# Patient Record
Sex: Female | Born: 1975 | Race: Black or African American | Hispanic: No | Marital: Single | Smoking: Current every day smoker
Health system: Southern US, Community
[De-identification: ages and names within clinical notes are randomized; demographics above are authoritative.]

## PROBLEM LIST (undated history)

## (undated) DIAGNOSIS — O09529 Supervision of elderly multigravida, unspecified trimester: Secondary | ICD-10-CM

## (undated) DIAGNOSIS — O30109 Triplet pregnancy, unspecified number of placenta and unspecified number of amniotic sacs, unspecified trimester: Secondary | ICD-10-CM

## (undated) HISTORY — PX: TUBAL LIGATION: SHX77

## (undated) HISTORY — PX: TONSILLECTOMY: SUR1361

---

## 1999-09-21 ENCOUNTER — Inpatient Hospital Stay (HOSPITAL_COMMUNITY): Admission: AD | Admit: 1999-09-21 | Discharge: 1999-09-24 | Payer: Self-pay | Admitting: *Deleted

## 1999-09-25 ENCOUNTER — Inpatient Hospital Stay: Admission: AD | Admit: 1999-09-25 | Discharge: 1999-09-25 | Payer: Self-pay | Admitting: Obstetrics & Gynecology

## 1999-11-11 ENCOUNTER — Inpatient Hospital Stay (HOSPITAL_COMMUNITY): Admission: AD | Admit: 1999-11-11 | Discharge: 1999-11-11 | Payer: Self-pay | Admitting: Obstetrics

## 1999-11-24 ENCOUNTER — Inpatient Hospital Stay (HOSPITAL_COMMUNITY): Admission: AD | Admit: 1999-11-24 | Discharge: 1999-11-24 | Payer: Self-pay | Admitting: *Deleted

## 1999-12-03 ENCOUNTER — Inpatient Hospital Stay (HOSPITAL_COMMUNITY): Admission: AD | Admit: 1999-12-03 | Discharge: 1999-12-06 | Payer: Self-pay | Admitting: Obstetrics & Gynecology

## 2002-09-25 ENCOUNTER — Inpatient Hospital Stay (HOSPITAL_COMMUNITY): Admission: AD | Admit: 2002-09-25 | Discharge: 2002-09-25 | Payer: Self-pay | Admitting: *Deleted

## 2015-07-09 ENCOUNTER — Other Ambulatory Visit (HOSPITAL_COMMUNITY): Payer: Self-pay | Admitting: Obstetrics and Gynecology

## 2015-07-09 DIAGNOSIS — Z3A19 19 weeks gestation of pregnancy: Secondary | ICD-10-CM

## 2015-07-09 DIAGNOSIS — O30102 Triplet pregnancy, unspecified number of placenta and unspecified number of amniotic sacs, second trimester: Secondary | ICD-10-CM

## 2015-07-09 DIAGNOSIS — Z3689 Encounter for other specified antenatal screening: Secondary | ICD-10-CM

## 2015-07-09 DIAGNOSIS — O0932 Supervision of pregnancy with insufficient antenatal care, second trimester: Secondary | ICD-10-CM

## 2015-07-09 LAB — OB RESULTS CONSOLE HEPATITIS B SURFACE ANTIGEN: Hepatitis B Surface Ag: NEGATIVE

## 2015-07-09 LAB — OB RESULTS CONSOLE RPR
RPR: NONREACTIVE
RPR: NONREACTIVE

## 2015-07-09 LAB — OB RESULTS CONSOLE GC/CHLAMYDIA
CHLAMYDIA, DNA PROBE: NEGATIVE
Gonorrhea: NEGATIVE

## 2015-07-09 LAB — CYTOLOGY - PAP

## 2015-07-09 LAB — OB RESULTS CONSOLE HGB/HCT, BLOOD
HCT: 30 %
Hemoglobin: 10 g/dL

## 2015-07-09 LAB — OB RESULTS CONSOLE ANTIBODY SCREEN: ANTIBODY SCREEN: NEGATIVE

## 2015-07-09 LAB — OB RESULTS CONSOLE RUBELLA ANTIBODY, IGM: Rubella: IMMUNE

## 2015-07-09 LAB — OB RESULTS CONSOLE PLATELET COUNT: Platelets: 323 10*3/uL

## 2015-07-09 LAB — OB RESULTS CONSOLE ABO/RH: RH Type: POSITIVE

## 2015-07-09 LAB — OB RESULTS CONSOLE HIV ANTIBODY (ROUTINE TESTING): HIV: NONREACTIVE

## 2015-07-10 ENCOUNTER — Ambulatory Visit (HOSPITAL_COMMUNITY)
Admission: RE | Admit: 2015-07-10 | Discharge: 2015-07-10 | Disposition: A | Discharge status: Home / Self Care | Payer: Medicaid Other | Source: Ambulatory Visit | Attending: Obstetrics and Gynecology | Admitting: Obstetrics and Gynecology

## 2015-07-10 ENCOUNTER — Encounter (HOSPITAL_COMMUNITY): Payer: Self-pay

## 2015-07-10 ENCOUNTER — Other Ambulatory Visit (HOSPITAL_COMMUNITY): Payer: Self-pay | Admitting: Obstetrics and Gynecology

## 2015-07-10 DIAGNOSIS — O30102 Triplet pregnancy, unspecified number of placenta and unspecified number of amniotic sacs, second trimester: Secondary | ICD-10-CM | POA: Diagnosis not present

## 2015-07-10 DIAGNOSIS — Z3A19 19 weeks gestation of pregnancy: Secondary | ICD-10-CM | POA: Diagnosis not present

## 2015-07-10 DIAGNOSIS — O99332 Smoking (tobacco) complicating pregnancy, second trimester: Secondary | ICD-10-CM

## 2015-07-10 DIAGNOSIS — Z315 Encounter for genetic counseling: Secondary | ICD-10-CM | POA: Insufficient documentation

## 2015-07-10 DIAGNOSIS — O0932 Supervision of pregnancy with insufficient antenatal care, second trimester: Secondary | ICD-10-CM

## 2015-07-10 DIAGNOSIS — Z3689 Encounter for other specified antenatal screening: Secondary | ICD-10-CM

## 2015-07-10 DIAGNOSIS — O09522 Supervision of elderly multigravida, second trimester: Secondary | ICD-10-CM | POA: Diagnosis not present

## 2015-07-10 DIAGNOSIS — O09529 Supervision of elderly multigravida, unspecified trimester: Secondary | ICD-10-CM

## 2015-07-10 HISTORY — DX: Triplet pregnancy, unspecified number of placenta and unspecified number of amniotic sacs, unspecified trimester: O30.109

## 2015-07-10 HISTORY — DX: Supervision of elderly multigravida, unspecified trimester: O09.529

## 2015-07-10 NOTE — Progress Notes (Signed)
Genetic Counseling  High-Risk Gestation Note  Appointment Date:  07/10/2015 Referred By: Marissa Newport, MD Date of Birth:  1975/12/14   Pregnancy History: [id number] Estimated Date of Delivery: 11/29/15 Estimated Gestational Age: [redacted]w[redacted]d Attending: Particia Nearing, MD   [name] was seen for genetic counseling because of a maternal age of 39 y.o. in a triplet (trichorionic/triamniotic) gestation. She will be 39 years old at delivery.   In Summary:   Reviewed maternal age-related risk for aneuploidy.   Available screening for fetal aneuploidy in triplet gestation includes detailed ultrasound.   Maternal serum screening and NIPS not informative for higher order multiples  Detailed ultrasound performed today and within normal limits  Patient declined amniocentesis  Follow-up ultrasound scheduled for 08/07/15  She was counseled regarding maternal age and the association with risk for chromosome conditions due to nondisjunction with aging of the ova.   We reviewed chromosomes, nondisjunction, and the associated 1 in 111 risk for fetal aneuploidy for each triplet related to a maternal age of 39 y.o. at [redacted]w[redacted]d gestation.  She was counseled that the risk for aneuploidy decreases as gestational age increases, accounting for those pregnancies which spontaneously abort.  We specifically discussed Down syndrome (trisomy 59), trisomies 23 and 52, and sex chromosome aneuploidies (47,XXX and 47,XXY) including the common features and prognoses of each.   We reviewed available screening options include detailed ultrasound. Maternal serum screening is not informative in a triplet gestation, and noninvasive prenatal screening (NIPS)/cell free DNA is not validated in a triplet gestation. We reviewed the benefits and limitations of ultrasound as a screening tool for fetal aneuploidy. They were also counseled regarding diagnostic testing via amniocentesis. We reviewed the approximate 1 in 300-500 risk for  complications for amniocentesis, including spontaneous pregnancy loss. After consideration of all the options, she declined amniocentesis.   Detailed ultrasound was performed today. The ultrasound report will be sent under separate cover. Trichorionic triamniotic triplet gestation was confirmed today. There were no visualized fetal anomalies or markers suggestive of aneuploidy. They understand that screening tests cannot rule out all birth defects or genetic syndromes. The patient was advised of this limitation and states she still does not want additional testing at this time.   [name] was provided with written information regarding sickle cell anemia (SCA) including the carrier frequency and incidence in the African-American population, the availability of carrier testing and prenatal diagnosis if indicated.  In addition, we discussed that hemoglobinopathies are routinely screened for as part of the [place] newborn screening panel.  She declined additional discussion regarding this information at the time of today's visit.   Both family histories were reviewed and found to be noncontributory for birth defects, intellectual disability, and known genetic conditions. Without further information regarding the provided family history, an accurate genetic risk cannot be calculated. Further genetic counseling is warranted if more information is obtained.  [name] denied exposure to environmental toxins or chemical agents. She denied the use of alcohol or street drugs. She reported smoking approximately 3 cigarettes per day. The associations of smoking in pregnancy were reviewed and cessation encouraged. She denied significant viral illnesses during the course of her pregnancy. Her medical and surgical histories were noncontributory.   I counseled [name] regarding the above risks and available options.  The approximate face-to-face time with the genetic counselor was 30 minutes.  Quinn Plowman, MS,  Certified Genetic Counselor 07/10/2015

## 2015-07-21 ENCOUNTER — Encounter (HOSPITAL_COMMUNITY): Payer: Self-pay | Admitting: Obstetrics and Gynecology

## 2015-07-28 LAB — COLPOSCOPY

## 2015-08-06 ENCOUNTER — Other Ambulatory Visit (HOSPITAL_COMMUNITY): Payer: Self-pay | Admitting: Maternal and Fetal Medicine

## 2015-08-06 DIAGNOSIS — O99332 Smoking (tobacco) complicating pregnancy, second trimester: Secondary | ICD-10-CM

## 2015-08-06 DIAGNOSIS — O30102 Triplet pregnancy, unspecified number of placenta and unspecified number of amniotic sacs, second trimester: Secondary | ICD-10-CM

## 2015-08-06 DIAGNOSIS — O09522 Supervision of elderly multigravida, second trimester: Secondary | ICD-10-CM

## 2015-08-06 DIAGNOSIS — Z3A23 23 weeks gestation of pregnancy: Secondary | ICD-10-CM

## 2015-08-07 ENCOUNTER — Encounter (HOSPITAL_COMMUNITY): Payer: Self-pay

## 2015-08-07 ENCOUNTER — Ambulatory Visit (HOSPITAL_COMMUNITY)
Admission: RE | Admit: 2015-08-07 | Discharge: 2015-08-07 | Disposition: A | Discharge status: Home / Self Care | Payer: Medicaid Other | Source: Ambulatory Visit | Attending: Obstetrics and Gynecology | Admitting: Obstetrics and Gynecology

## 2015-08-07 VITALS — BP 120/79 | HR 126 | Wt 141.4 lb

## 2015-08-07 DIAGNOSIS — Z3A23 23 weeks gestation of pregnancy: Secondary | ICD-10-CM | POA: Insufficient documentation

## 2015-08-07 DIAGNOSIS — O09522 Supervision of elderly multigravida, second trimester: Secondary | ICD-10-CM | POA: Insufficient documentation

## 2015-08-07 DIAGNOSIS — O99332 Smoking (tobacco) complicating pregnancy, second trimester: Secondary | ICD-10-CM | POA: Diagnosis not present

## 2015-08-07 DIAGNOSIS — Z3A26 26 weeks gestation of pregnancy: Secondary | ICD-10-CM

## 2015-08-07 DIAGNOSIS — O30102 Triplet pregnancy, unspecified number of placenta and unspecified number of amniotic sacs, second trimester: Secondary | ICD-10-CM | POA: Insufficient documentation

## 2015-08-07 DIAGNOSIS — O0932 Supervision of pregnancy with insufficient antenatal care, second trimester: Secondary | ICD-10-CM | POA: Insufficient documentation

## 2015-08-20 ENCOUNTER — Encounter: Payer: Self-pay | Admitting: *Deleted

## 2015-08-20 ENCOUNTER — Encounter: Payer: Medicaid Other | Admitting: Family

## 2015-08-21 ENCOUNTER — Encounter: Payer: Medicaid Other | Admitting: Family Medicine

## 2015-08-29 ENCOUNTER — Ambulatory Visit (HOSPITAL_COMMUNITY): Payer: Medicaid Other

## 2015-08-29 ENCOUNTER — Telehealth: Payer: Self-pay | Admitting: *Deleted

## 2015-08-29 NOTE — Telephone Encounter (Signed)
Returned patient's call. She stated she has had a bad toothache and is in a lot of pain. Wants to know what she can take. I told her that the only thing I could recommend at this time is Tylenol for pain, she can not take ibuprofen or aleve while pregnant. Asked her if she has a dentist she can see, she said no. Stated her mouth is swollen. She is scheduled to come for her initial ob appt on Mon, 10/24.  Told patient she probably needed to come to [facility] for eval and treatment with antibiotics. Patient stated she would do that.

## 2015-08-29 NOTE — Telephone Encounter (Signed)
Received message left on nurse line on 08/28/15 at 1405.  Patient states she is a new patient with her first appointment on 09/01/15.  States she is pregnant with triplets and has a very bad tooth ache.  Wants to know what she can take.  Requests a return call.

## 2015-09-01 ENCOUNTER — Other Ambulatory Visit: Payer: Self-pay | Admitting: Obstetrics and Gynecology

## 2015-09-01 ENCOUNTER — Ambulatory Visit (INDEPENDENT_AMBULATORY_CARE_PROVIDER_SITE_OTHER): Payer: Medicaid Other | Admitting: Obstetrics and Gynecology

## 2015-09-01 ENCOUNTER — Encounter: Payer: Self-pay | Admitting: *Deleted

## 2015-09-01 ENCOUNTER — Encounter: Payer: Self-pay | Admitting: Obstetrics and Gynecology

## 2015-09-01 VITALS — BP 106/66 | HR 104 | Temp 98.9°F | Ht 64.0 in | Wt 143.4 lb

## 2015-09-01 DIAGNOSIS — Z23 Encounter for immunization: Secondary | ICD-10-CM

## 2015-09-01 DIAGNOSIS — O30109 Triplet pregnancy, unspecified number of placenta and unspecified number of amniotic sacs, unspecified trimester: Secondary | ICD-10-CM | POA: Diagnosis not present

## 2015-09-01 DIAGNOSIS — O0990 Supervision of high risk pregnancy, unspecified, unspecified trimester: Secondary | ICD-10-CM | POA: Insufficient documentation

## 2015-09-01 DIAGNOSIS — O09522 Supervision of elderly multigravida, second trimester: Secondary | ICD-10-CM | POA: Diagnosis not present

## 2015-09-01 DIAGNOSIS — O99330 Smoking (tobacco) complicating pregnancy, unspecified trimester: Secondary | ICD-10-CM

## 2015-09-01 DIAGNOSIS — O30102 Triplet pregnancy, unspecified number of placenta and unspecified number of amniotic sacs, second trimester: Secondary | ICD-10-CM | POA: Diagnosis not present

## 2015-09-01 DIAGNOSIS — E0869 Diabetes mellitus due to underlying condition with other specified complication: Secondary | ICD-10-CM

## 2015-09-01 LAB — POCT URINALYSIS DIP (DEVICE)
Glucose, UA: NEGATIVE mg/dL
Hgb urine dipstick: NEGATIVE
KETONES UR: NEGATIVE mg/dL
Leukocytes, UA: NEGATIVE
Nitrite: NEGATIVE
PH: 7 (ref 5.0–8.0)
PROTEIN: 30 mg/dL — AB
SPECIFIC GRAVITY, URINE: 1.02 (ref 1.005–1.030)
UROBILINOGEN UA: 1 mg/dL (ref 0.0–1.0)

## 2015-09-01 LAB — CBC
HEMATOCRIT: 30.3 % — AB (ref 36.0–46.0)
HEMOGLOBIN: 10 g/dL — AB (ref 12.0–15.0)
MCH: 26.2 pg (ref 26.0–34.0)
MCHC: 33 g/dL (ref 30.0–36.0)
MCV: 79.5 fL (ref 78.0–100.0)
MPV: 9.2 fL (ref 8.6–12.4)
Platelets: 304 10*3/uL (ref 150–400)
RBC: 3.81 MIL/uL — ABNORMAL LOW (ref 3.87–5.11)
RDW: 14.7 % (ref 11.5–15.5)
WBC: 7.2 10*3/uL (ref 4.0–10.5)

## 2015-09-01 MED ORDER — ASPIRIN EC 81 MG PO TBEC: 81.0000 mg | DELAYED_RELEASE_TABLET | Freq: Every day | ORAL | Status: DC

## 2015-09-01 NOTE — Progress Notes (Signed)
Subjective:  Marissa Whitaker is a 39 y.o. G7P5015 at 4429w2d being seen today for ongoing initial care.  Patient reports no complaints.  Contractions: Not present.  Vag. Bleeding: None. Movement: Present. Denies leaking of fluid.   The following portions of the patient's history were reviewed and updated as appropriate: allergies, current medications, past family history, past medical history, past social history, past surgical history and problem list. Problem list updated.  Objective:   Filed Vitals:   09/01/15 0933 09/01/15 0935  BP: 106/66   Pulse: 104   Temp: 98.9 F (37.2 C)   Height:  5\' 4"  (1.626 m)  Weight: 143 lb 6.4 oz (65.046 kg)     Fetal Status: Fetal Heart Rate (bpm): 135/128/134 Fundal Height: 39 cm Movement: Present     General:  Alert, oriented and cooperative. Patient is in no acute distress.  Skin: Skin is warm and dry. No rash noted.   Cardiovascular: Normal heart rate noted  Respiratory: Normal respiratory effort, no problems with respiration noted  Abdomen: Soft, gravid, appropriate for gestational age. Pain/Pressure: Present     Pelvic: Vag. Bleeding: None     Cervical exam deferred        Extremities: Normal range of motion.  Edema: None  Mental Status: Normal mood and affect. Normal behavior. Normal judgment and thought content.   Urinalysis: Urine Protein: Trace Urine Glucose: Negative  Assessment and Plan:  Pregnancy: [id number] at 5029w2d  # Pregnancy - Glucose Tolerance, 1 HR (50g) - cbc, hiv, rpr with this test - pt reporting ascus pap, requesting those records - flu and tdap today  2. Tobacco use during pregnancy, unspecified trimester - discussed dangers, recommended quitting. Has reduced, declines nrt  # multifetal gestation - starting aspirin - q4 wk mfm u/s, discordance 16% last scan, growth wnl  Preterm labor symptoms and general obstetric precautions including but not limited to vaginal bleeding, contractions, leaking of fluid and fetal  movement were reviewed in detail with the patient. Please refer to After Visit Summary for other counseling recommendations.  Return in about 2 weeks (around 09/15/2015).   Kathrynn RunningNoah Bedford Horton Ellithorpe, MD

## 2015-09-01 NOTE — Addendum Note (Signed)
Addended by: Cheree DittoGRAHAM, Milbern Doescher A on: 09/01/2015 10:21 AM   Modules accepted: Orders

## 2015-09-02 ENCOUNTER — Encounter: Payer: Self-pay | Admitting: *Deleted

## 2015-09-02 ENCOUNTER — Ambulatory Visit (HOSPITAL_COMMUNITY): Payer: Medicaid Other

## 2015-09-02 LAB — GLUCOSE TOLERANCE, 1 HOUR (50G) W/O FASTING: Glucose, 1 Hour GTT: 107 mg/dL (ref 70–140)

## 2015-09-02 LAB — RPR

## 2015-09-02 LAB — HIV ANTIBODY (ROUTINE TESTING W REFLEX): HIV 1&2 Ab, 4th Generation: NONREACTIVE

## 2015-09-02 NOTE — Addendum Note (Signed)
Addended by: Shonna ChockWOUK, NOAH B on: 09/02/2015 09:51 AM   Modules accepted: Orders

## 2015-09-02 NOTE — Addendum Note (Signed)
Addended by: Kathee DeltonHILLMAN, CARRIE L on: 09/02/2015 11:34 AM   Modules accepted: Orders

## 2015-09-03 ENCOUNTER — Encounter: Payer: Self-pay | Admitting: Family Medicine

## 2015-09-03 DIAGNOSIS — N870 Mild cervical dysplasia: Secondary | ICD-10-CM | POA: Insufficient documentation

## 2015-09-04 ENCOUNTER — Telehealth: Payer: Self-pay | Admitting: Obstetrics and Gynecology

## 2015-09-04 LAB — FERRITIN: Ferritin: 13 ng/mL (ref 10–291)

## 2015-09-04 MED ORDER — FERROUS SULFATE 325 (65 FE) MG PO TABS: 325.0000 mg | ORAL_TABLET | Freq: Every day | ORAL | Status: AC

## 2015-09-04 NOTE — Telephone Encounter (Signed)
Per Dr Ashok PallWouk, called patient to let her know she is anemic. She has a Fe prescription waiting at her pharmacy. Patient voiced understanding.

## 2015-09-04 NOTE — Telephone Encounter (Signed)
H 10, ferritin low, will start oral iron, nursing to inform

## 2015-09-15 ENCOUNTER — Encounter: Payer: Medicaid Other | Admitting: Family Medicine

## 2015-09-15 ENCOUNTER — Ambulatory Visit (HOSPITAL_COMMUNITY): Payer: Self-pay

## 2015-09-16 ENCOUNTER — Other Ambulatory Visit (HOSPITAL_COMMUNITY): Payer: Self-pay | Admitting: Maternal and Fetal Medicine

## 2015-09-16 ENCOUNTER — Ambulatory Visit (HOSPITAL_COMMUNITY)
Admission: RE | Admit: 2015-09-16 | Discharge: 2015-09-16 | Disposition: A | Discharge status: Home / Self Care | Payer: Medicaid Other | Source: Ambulatory Visit | Attending: Obstetrics and Gynecology | Admitting: Obstetrics and Gynecology

## 2015-09-16 VITALS — BP 107/72 | HR 105 | Wt 148.0 lb

## 2015-09-16 DIAGNOSIS — O09522 Supervision of elderly multigravida, second trimester: Secondary | ICD-10-CM

## 2015-09-16 DIAGNOSIS — O30102 Triplet pregnancy, unspecified number of placenta and unspecified number of amniotic sacs, second trimester: Secondary | ICD-10-CM | POA: Insufficient documentation

## 2015-09-16 DIAGNOSIS — Z3A26 26 weeks gestation of pregnancy: Secondary | ICD-10-CM

## 2015-09-16 DIAGNOSIS — O30103 Triplet pregnancy, unspecified number of placenta and unspecified number of amniotic sacs, third trimester: Secondary | ICD-10-CM

## 2015-09-22 ENCOUNTER — Encounter: Payer: Self-pay | Admitting: Family Medicine

## 2015-09-22 ENCOUNTER — Ambulatory Visit (INDEPENDENT_AMBULATORY_CARE_PROVIDER_SITE_OTHER): Payer: Medicaid Other | Admitting: Family Medicine

## 2015-09-22 VITALS — BP 106/76 | HR 95 | Temp 99.1°F | Wt 148.3 lb

## 2015-09-22 DIAGNOSIS — O09523 Supervision of elderly multigravida, third trimester: Secondary | ICD-10-CM | POA: Diagnosis not present

## 2015-09-22 DIAGNOSIS — O0993 Supervision of high risk pregnancy, unspecified, third trimester: Secondary | ICD-10-CM

## 2015-09-22 DIAGNOSIS — O09529 Supervision of elderly multigravida, unspecified trimester: Secondary | ICD-10-CM

## 2015-09-22 DIAGNOSIS — O4700 False labor before 37 completed weeks of gestation, unspecified trimester: Secondary | ICD-10-CM | POA: Insufficient documentation

## 2015-09-22 DIAGNOSIS — O30103 Triplet pregnancy, unspecified number of placenta and unspecified number of amniotic sacs, third trimester: Secondary | ICD-10-CM | POA: Diagnosis present

## 2015-09-22 DIAGNOSIS — O4703 False labor before 37 completed weeks of gestation, third trimester: Secondary | ICD-10-CM | POA: Diagnosis not present

## 2015-09-22 LAB — POCT URINALYSIS DIP (DEVICE)
Bilirubin Urine: NEGATIVE
Glucose, UA: NEGATIVE mg/dL
HGB URINE DIPSTICK: NEGATIVE
Ketones, ur: NEGATIVE mg/dL
Leukocytes, UA: NEGATIVE
Nitrite: NEGATIVE
PROTEIN: 30 mg/dL — AB
Specific Gravity, Urine: 1.02 (ref 1.005–1.030)
UROBILINOGEN UA: 0.2 mg/dL (ref 0.0–1.0)
pH: 7 (ref 5.0–8.0)

## 2015-09-22 MED ORDER — BETAMETHASONE SOD PHOS & ACET 6 (3-3) MG/ML IJ SUSP
12.0000 mg | INTRAMUSCULAR | Status: AC
  Administered 2015-09-22 – 2015-09-23 (×2): 12 mg via INTRAMUSCULAR

## 2015-09-22 MED ORDER — NIFEDIPINE ER OSMOTIC RELEASE 30 MG PO TB24: 30.0000 mg | ORAL_TABLET | Freq: Two times a day (BID) | ORAL | Status: DC

## 2015-09-22 NOTE — Progress Notes (Signed)
Subjective:  Marissa Whitaker is a 39 y.o. G7P5015 at 2170w2d being seen today for ongoing prenatal care.  Patient reports contractions since 2 days ago every 7-8 minutes, with ? LOF.  Contractions: Regular.  Vag. Bleeding: None. Movement: Present. Denies leaking of fluid.   The following portions of the patient's history were reviewed and updated as appropriate: allergies, current medications, past family history, past medical history, past social history, past surgical history and problem list. Problem list updated.  Objective:   Filed Vitals:   09/22/15 1040  BP: 106/76  Pulse: 95  Temp: 99.1 F (37.3 C)  Weight: 148 lb 4.8 oz (67.268 kg)    Fetal Status: Fetal Heart Rate (bpm): 133/133/138 Fundal Height: 44 cm Movement: Present  Presentation: Vertex  General:  Alert, oriented and cooperative. Patient is in no acute distress.  Skin: Skin is warm and dry. No rash noted.   Cardiovascular: Normal heart rate noted  Respiratory: Normal respiratory effort, no problems with respiration noted  Abdomen: Soft, gravid, appropriate for gestational age. Pain/Pressure: Present     Pelvic: Vag. Bleeding: None Vag D/C Character: Watery   Cervical exam performed Dilation: 2 Effacement (%): Thick Station: -3  Extremities: Normal range of motion.  Edema: None  Mental Status: Normal mood and affect. Normal behavior. Normal judgment and thought content.   Urinalysis: Urine Protein: Trace Urine Glucose: Negative  Assessment and Plan:  Pregnancy: [id number] at 270w2d  1. Supervision of high-risk pregnancy, third trimester Continue routine prenatal care.  2. Threatened preterm labor, third trimester Begin Procardia PTL precautions BMZ today and tomorrow - NIFEdipine (PROCARDIA XL) 30 MG 24 hr tablet; Take 1 tablet (30 mg total) by mouth 2 (two) times daily.  Dispense: 60 tablet; Refill: 1 - betamethasone acetate-betamethasone sodium phosphate (CELESTONE) injection 12 mg; Inject 2 mLs (12 mg total) into the  muscle every 24 hours x 2 doses.  3. Triplet gestation in third trimester, unspecified placenta and amniotic sac number Normal growth x 3 and vtx x 3  Preterm labor symptoms and general obstetric precautions including but not limited to vaginal bleeding, contractions, leaking of fluid and fetal movement were reviewed in detail with the patient. Please refer to After Visit Summary for other counseling recommendations.  Return in 2 weeks (on 10/06/2015).   Reva Boresanya S Pratt, MD

## 2015-09-22 NOTE — Patient Instructions (Signed)
Third Trimester of Pregnancy The third trimester is from week 29 through week 42, months 7 through 9. The third trimester is a time when the fetus is growing rapidly. At the end of the ninth month, the fetus is about 20 inches in length and weighs 6-10 pounds.  BODY CHANGES Your body goes through many changes during pregnancy. The changes vary from woman to woman.   Your weight will continue to increase. You can expect to gain 25-35 pounds (11-16 kg) by the end of the pregnancy.  You may begin to get stretch marks on your hips, abdomen, and breasts.  You may urinate more often because the fetus is moving lower into your pelvis and pressing on your bladder.  You may develop or continue to have heartburn as a result of your pregnancy.  You may develop constipation because certain hormones are causing the muscles that push waste through your intestines to slow down.  You may develop hemorrhoids or swollen, bulging veins (varicose veins).  You may have pelvic pain because of the weight gain and pregnancy hormones relaxing your joints between the bones in your pelvis. Backaches may result from overexertion of the muscles supporting your posture.  You may have changes in your hair. These can include thickening of your hair, rapid growth, and changes in texture. Some women also have hair loss during or after pregnancy, or hair that feels dry or thin. Your hair will most likely return to normal after your baby is born.  Your breasts will continue to grow and be tender. A yellow discharge may leak from your breasts called colostrum.  Your belly button may stick out.  You may feel short of breath because of your expanding uterus.  You may notice the fetus "dropping," or moving lower in your abdomen.  You may have a bloody mucus discharge. This usually occurs a few days to a week before labor begins.  Your cervix becomes thin and soft (effaced) near your due date. WHAT TO EXPECT AT YOUR  PRENATAL EXAMS  You will have prenatal exams every 2 weeks until week 36. Then, you will have weekly prenatal exams. During a routine prenatal visit:  You will be weighed to make sure you and the fetus are growing normally.  Your blood pressure is taken.  Your abdomen will be measured to track your baby's growth.  The fetal heartbeat will be listened to.  Any test results from the previous visit will be discussed.  You may have a cervical check near your due date to see if you have effaced. At around 36 weeks, your caregiver will check your cervix. At the same time, your caregiver will also perform a test on the secretions of the vaginal tissue. This test is to determine if a type of bacteria, Group B streptococcus, is present. Your caregiver will explain this further. Your caregiver may ask you:  What your birth plan is.  How you are feeling.  If you are feeling the baby move.  If you have had any abnormal symptoms, such as leaking fluid, bleeding, severe headaches, or abdominal cramping.  If you are using any tobacco products, including cigarettes, chewing tobacco, and electronic cigarettes.  If you have any questions. Other tests or screenings that may be performed during your third trimester include:  Blood tests that check for low iron levels (anemia).  Fetal testing to check the health, activity level, and growth of the fetus. Testing is done if you have certain medical conditions or if   there are problems during the pregnancy.  HIV (human immunodeficiency virus) testing. If you are at high risk, you may be screened for HIV during your third trimester of pregnancy. FALSE LABOR You may feel small, irregular contractions that eventually go away. These are called Braxton Hicks contractions, or false labor. Contractions may last for hours, days, or even weeks before true labor sets in. If contractions come at regular intervals, intensify, or become painful, it is best to be seen  by your caregiver.  SIGNS OF LABOR   Menstrual-like cramps.  Contractions that are 5 minutes apart or less.  Contractions that start on the top of the uterus and spread down to the lower abdomen and back.  A sense of increased pelvic pressure or back pain.  A watery or bloody mucus discharge that comes from the vagina. If you have any of these signs before the 37th week of pregnancy, call your caregiver right away. You need to go to the hospital to get checked immediately. HOME CARE INSTRUCTIONS   Avoid all smoking, herbs, alcohol, and unprescribed drugs. These chemicals affect the formation and growth of the baby.  Do not use any tobacco products, including cigarettes, chewing tobacco, and electronic cigarettes. If you need help quitting, ask your health care provider. You may receive counseling support and other resources to help you quit.  Follow your caregiver's instructions regarding medicine use. There are medicines that are either safe or unsafe to take during pregnancy.  Exercise only as directed by your caregiver. Experiencing uterine cramps is a good sign to stop exercising.  Continue to eat regular, healthy meals.  Wear a good support bra for breast tenderness.  Do not use hot tubs, steam rooms, or saunas.  Wear your seat belt at all times when driving.  Avoid raw meat, uncooked cheese, cat litter boxes, and soil used by cats. These carry germs that can cause birth defects in the baby.  Take your prenatal vitamins.  Take 1500-2000 mg of calcium daily starting at the 20th week of pregnancy until you deliver your baby.  Try taking a stool softener (if your caregiver approves) if you develop constipation. Eat more high-fiber foods, such as fresh vegetables or fruit and whole grains. Drink plenty of fluids to keep your urine clear or pale yellow.  Take warm sitz baths to soothe any pain or discomfort caused by hemorrhoids. Use hemorrhoid cream if your caregiver  approves.  If you develop varicose veins, wear support hose. Elevate your feet for 15 minutes, 3-4 times a day. Limit salt in your diet.  Avoid heavy lifting, wear low heal shoes, and practice good posture.  Rest a lot with your legs elevated if you have leg cramps or low back pain.  Visit your dentist if you have not gone during your pregnancy. Use a soft toothbrush to brush your teeth and be gentle when you floss.  A sexual relationship may be continued unless your caregiver directs you otherwise.  Do not travel far distances unless it is absolutely necessary and only with the approval of your caregiver.  Take prenatal classes to understand, practice, and ask questions about the labor and delivery.  Make a trial run to the hospital.  Pack your hospital bag.  Prepare the baby's nursery.  Continue to go to all your prenatal visits as directed by your caregiver. SEEK MEDICAL CARE IF:  You are unsure if you are in labor or if your water has broken.  You have dizziness.  You have   mild pelvic cramps, pelvic pressure, or nagging pain in your abdominal area.  You have persistent nausea, vomiting, or diarrhea.  You have a bad smelling vaginal discharge.  You have pain with urination. SEEK IMMEDIATE MEDICAL CARE IF:   You have a fever.  You are leaking fluid from your vagina.  You have spotting or bleeding from your vagina.  You have severe abdominal cramping or pain.  You have rapid weight loss or gain.  You have shortness of breath with chest pain.  You notice sudden or extreme swelling of your face, hands, ankles, feet, or legs.  You have not felt your baby move in over an hour.  You have severe headaches that do not go away with medicine.  You have vision changes.   This information is not intended to replace advice given to you by your health care provider. Make sure you discuss any questions you have with your health care provider.   Document Released:  10/19/2001 Document Revised: 11/15/2014 Document Reviewed: 12/26/2012 Elsevier Interactive Patient Education 2016 [employer]  Breastfeeding Deciding to breastfeed is one of the best choices you can make for you and your baby. A change in hormones during pregnancy causes your breast tissue to grow and increases the number and size of your milk ducts. These hormones also allow proteins, sugars, and fats from your blood supply to make breast milk in your milk-producing glands. Hormones prevent breast milk from being released before your baby is born as well as prompt milk flow after birth. Once breastfeeding has begun, thoughts of your baby, as well as his or her sucking or crying, can stimulate the release of milk from your milk-producing glands.  BENEFITS OF BREASTFEEDING For Your Baby  Your first milk (colostrum) helps your baby's digestive system function better.  There are antibodies in your milk that help your baby fight off infections.  Your baby has a lower incidence of asthma, allergies, and sudden infant death syndrome.  The nutrients in breast milk are better for your baby than infant formulas and are designed uniquely for your baby's needs.  Breast milk improves your baby's brain development.  Your baby is less likely to develop other conditions, such as childhood obesity, asthma, or type 2 diabetes mellitus. For You  Breastfeeding helps to create a very special bond between you and your baby.  Breastfeeding is convenient. Breast milk is always available at the correct temperature and costs nothing.  Breastfeeding helps to burn calories and helps you lose the weight gained during pregnancy.  Breastfeeding makes your uterus contract to its prepregnancy size faster and slows bleeding (lochia) after you give birth.   Breastfeeding helps to lower your risk of developing type 2 diabetes mellitus, osteoporosis, and breast or ovarian cancer later in life. SIGNS THAT YOUR BABY IS  HUNGRY Early Signs of Hunger  Increased alertness or activity.  Stretching.  Movement of the head from side to side.  Movement of the head and opening of the mouth when the corner of the mouth or cheek is stroked (rooting).  Increased sucking sounds, smacking lips, cooing, sighing, or squeaking.  Hand-to-mouth movements.  Increased sucking of fingers or hands. Late Signs of Hunger  Fussing.  Intermittent crying. Extreme Signs of Hunger Signs of extreme hunger will require calming and consoling before your baby will be able to breastfeed successfully. Do not wait for the following signs of extreme hunger to occur before you initiate breastfeeding:  Restlessness.  A loud, strong cry.  Screaming.   BREASTFEEDING BASICS Breastfeeding Initiation  Find a comfortable place to sit or lie down, with your neck and back well supported.  Place a pillow or rolled up blanket under your baby to bring him or her to the level of your breast (if you are seated). Nursing pillows are specially designed to help support your arms and your baby while you breastfeed.  Make sure that your baby's abdomen is facing your abdomen.  Gently massage your breast. With your fingertips, massage from your chest wall toward your nipple in a circular motion. This encourages milk flow. You may need to continue this action during the feeding if your milk flows slowly.  Support your breast with 4 fingers underneath and your thumb above your nipple. Make sure your fingers are well away from your nipple and your baby's mouth.  Stroke your baby's lips gently with your finger or nipple.  When your baby's mouth is open wide enough, quickly bring your baby to your breast, placing your entire nipple and as much of the colored area around your nipple (areola) as possible into your baby's mouth.  More areola should be visible above your baby's upper lip than below the lower lip.  Your baby's tongue should be between his  or her lower gum and your breast.  Ensure that your baby's mouth is correctly positioned around your nipple (latched). Your baby's lips should create a seal on your breast and be turned out (everted).  It is common for your baby to suck about 2-3 minutes in order to start the flow of breast milk. Latching Teaching your baby how to latch on to your breast properly is very important. An improper latch can cause nipple pain and decreased milk supply for you and poor weight gain in your baby. Also, if your baby is not latched onto your nipple properly, he or she may swallow some air during feeding. This can make your baby fussy. Burping your baby when you switch breasts during the feeding can help to get rid of the air. However, teaching your baby to latch on properly is still the best way to prevent fussiness from swallowing air while breastfeeding. Signs that your baby has successfully latched on to your nipple:  Silent tugging or silent sucking, without causing you pain.  Swallowing heard between every 3-4 sucks.  Muscle movement above and in front of his or her ears while sucking. Signs that your baby has not successfully latched on to nipple:  Sucking sounds or smacking sounds from your baby while breastfeeding.  Nipple pain. If you think your baby has not latched on correctly, slip your finger into the corner of your baby's mouth to break the suction and place it between your baby's gums. Attempt breastfeeding initiation again. Signs of Successful Breastfeeding Signs from your baby:  A gradual decrease in the number of sucks or complete cessation of sucking.  Falling asleep.  Relaxation of his or her body.  Retention of a small amount of milk in his or her mouth.  Letting go of your breast by himself or herself. Signs from you:  Breasts that have increased in firmness, weight, and size 1-3 hours after feeding.  Breasts that are softer immediately after  breastfeeding.  Increased milk volume, as well as a change in milk consistency and color by the fifth day of breastfeeding.  Nipples that are not sore, cracked, or bleeding. Signs That Your Baby is Getting Enough Milk  Wetting at least 3 diapers in a 24-hour period.   The urine should be clear and pale yellow by age 5 days.  At least 3 stools in a 24-hour period by age 5 days. The stool should be soft and yellow.  At least 3 stools in a 24-hour period by age 7 days. The stool should be seedy and yellow.  No loss of weight greater than 10% of birth weight during the first 3 days of age.  Average weight gain of 4-7 ounces (113-198 g) per week after age 4 days.  Consistent daily weight gain by age 5 days, without weight loss after the age of 2 weeks. After a feeding, your baby may spit up a small amount. This is common. BREASTFEEDING FREQUENCY AND DURATION Frequent feeding will help you make more milk and can prevent sore nipples and breast engorgement. Breastfeed when you feel the need to reduce the fullness of your breasts or when your baby shows signs of hunger. This is called "breastfeeding on demand." Avoid introducing a pacifier to your baby while you are working to establish breastfeeding (the first 4-6 weeks after your baby is born). After this time you may choose to use a pacifier. Research has shown that pacifier use during the first year of a baby's life decreases the risk of sudden infant death syndrome (SIDS). Allow your baby to feed on each breast as long as he or she wants. Breastfeed until your baby is finished feeding. When your baby unlatches or falls asleep while feeding from the first breast, offer the second breast. Because newborns are often sleepy in the first few weeks of life, you may need to awaken your baby to get him or her to feed. Breastfeeding times will vary from baby to baby. However, the following rules can serve as a guide to help you ensure that your baby is  properly fed:  Newborns (babies 4 weeks of age or younger) may breastfeed every 1-3 hours.  Newborns should not go longer than 3 hours during the day or 5 hours during the night without breastfeeding.  You should breastfeed your baby a minimum of 8 times in a 24-hour period until you begin to introduce solid foods to your baby at around 6 months of age. BREAST MILK PUMPING Pumping and storing breast milk allows you to ensure that your baby is exclusively fed your breast milk, even at times when you are unable to breastfeed. This is especially important if you are going back to work while you are still breastfeeding or when you are not able to be present during feedings. Your lactation consultant can give you guidelines on how long it is safe to store breast milk. A breast pump is a machine that allows you to pump milk from your breast into a sterile bottle. The pumped breast milk can then be stored in a refrigerator or freezer. Some breast pumps are operated by hand, while others use electricity. Ask your lactation consultant which type will work best for you. Breast pumps can be purchased, but some hospitals and breastfeeding support groups lease breast pumps on a monthly basis. A lactation consultant can teach you how to hand express breast milk, if you prefer not to use a pump. CARING FOR YOUR BREASTS WHILE YOU BREASTFEED Nipples can become dry, cracked, and sore while breastfeeding. The following recommendations can help keep your breasts moisturized and healthy:  Avoid using soap on your nipples.  Wear a supportive bra. Although not required, special nursing bras and tank tops are designed to allow access to your   breasts for breastfeeding without taking off your entire bra or top. Avoid wearing underwire-style bras or extremely tight bras.  Air dry your nipples for 3-4minutes after each feeding.  Use only cotton bra pads to absorb leaked breast milk. Leaking of breast milk between feedings  is normal.  Use lanolin on your nipples after breastfeeding. Lanolin helps to maintain your skin's normal moisture barrier. If you use pure lanolin, you do not need to wash it off before feeding your baby again. Pure lanolin is not toxic to your baby. You may also hand express a few drops of breast milk and gently massage that milk into your nipples and allow the milk to air dry. In the first few weeks after giving birth, some women experience extremely full breasts (engorgement). Engorgement can make your breasts feel heavy, warm, and tender to the touch. Engorgement peaks within 3-5 days after you give birth. The following recommendations can help ease engorgement:  Completely empty your breasts while breastfeeding or pumping. You may want to start by applying warm, moist heat (in the shower or with warm water-soaked hand towels) just before feeding or pumping. This increases circulation and helps the milk flow. If your baby does not completely empty your breasts while breastfeeding, pump any extra milk after he or she is finished.  Wear a snug bra (nursing or regular) or tank top for 1-2 days to signal your body to slightly decrease milk production.  Apply ice packs to your breasts, unless this is too uncomfortable for you.  Make sure that your baby is latched on and positioned properly while breastfeeding. If engorgement persists after 48 hours of following these recommendations, contact your health care provider or a lactation consultant. OVERALL HEALTH CARE RECOMMENDATIONS WHILE BREASTFEEDING  Eat healthy foods. Alternate between meals and snacks, eating 3 of each per day. Because what you eat affects your breast milk, some of the foods may make your baby more irritable than usual. Avoid eating these foods if you are sure that they are negatively affecting your baby.  Drink milk, fruit juice, and water to satisfy your thirst (about 10 glasses a day).  Rest often, relax, and continue to take  your prenatal vitamins to prevent fatigue, stress, and anemia.  Continue breast self-awareness checks.  Avoid chewing and smoking tobacco. Chemicals from cigarettes that pass into breast milk and exposure to secondhand smoke may harm your baby.  Avoid alcohol and drug use, including marijuana. Some medicines that may be harmful to your baby can pass through breast milk. It is important to ask your health care provider before taking any medicine, including all over-the-counter and prescription medicine as well as vitamin and herbal supplements. It is possible to become pregnant while breastfeeding. If birth control is desired, ask your health care provider about options that will be safe for your baby. SEEK MEDICAL CARE IF:  You feel like you want to stop breastfeeding or have become frustrated with breastfeeding.  You have painful breasts or nipples.  Your nipples are cracked or bleeding.  Your breasts are red, tender, or warm.  You have a swollen area on either breast.  You have a fever or chills.  You have nausea or vomiting.  You have drainage other than breast milk from your nipples.  Your breasts do not become full before feedings by the fifth day after you give birth.  You feel sad and depressed.  Your baby is too sleepy to eat well.  Your baby is having trouble sleeping.     Your baby is wetting less than 3 diapers in a 24-hour period.  Your baby has less than 3 stools in a 24-hour period.  Your baby's skin or the white part of his or her eyes becomes yellow.   Your baby is not gaining weight by 5 days of age. SEEK IMMEDIATE MEDICAL CARE IF:  Your baby is overly tired (lethargic) and does not want to wake up and feed.  Your baby develops an unexplained fever.   This information is not intended to replace advice given to you by your health care provider. Make sure you discuss any questions you have with your health care provider.   Document Released: 10/25/2005  Document Revised: 07/16/2015 Document Reviewed: 04/18/2013 Elsevier Interactive Patient Education 2016 [employer]  

## 2015-09-22 NOTE — Progress Notes (Signed)
Breastfeeding tip of the week reviewed Patient complains of watery leakage x2 days, contractions q7-8 minutes

## 2015-09-23 ENCOUNTER — Encounter (HOSPITAL_COMMUNITY): Payer: Self-pay

## 2015-09-23 ENCOUNTER — Encounter: Payer: Self-pay | Admitting: Obstetrics & Gynecology

## 2015-09-23 ENCOUNTER — Ambulatory Visit (HOSPITAL_COMMUNITY)
Admission: RE | Admit: 2015-09-23 | Discharge: 2015-09-23 | Disposition: A | Discharge status: Home / Self Care | Payer: Medicaid Other | Source: Ambulatory Visit | Attending: Obstetrics and Gynecology | Admitting: Obstetrics and Gynecology

## 2015-09-23 ENCOUNTER — Ambulatory Visit (INDEPENDENT_AMBULATORY_CARE_PROVIDER_SITE_OTHER): Payer: Medicaid Other | Admitting: *Deleted

## 2015-09-23 DIAGNOSIS — O0993 Supervision of high risk pregnancy, unspecified, third trimester: Secondary | ICD-10-CM

## 2015-09-23 DIAGNOSIS — O4703 False labor before 37 completed weeks of gestation, third trimester: Secondary | ICD-10-CM | POA: Diagnosis not present

## 2015-09-23 DIAGNOSIS — O30103 Triplet pregnancy, unspecified number of placenta and unspecified number of amniotic sacs, third trimester: Secondary | ICD-10-CM | POA: Diagnosis present

## 2015-09-23 NOTE — Progress Notes (Signed)
2nd Betamethasone injection given IM to LUOQ gluteal.  Pt tolerated well. US for BPP and UA dopplers today.

## 2015-09-24 ENCOUNTER — Other Ambulatory Visit: Payer: Self-pay | Admitting: Family Medicine

## 2015-09-24 DIAGNOSIS — O210 Mild hyperemesis gravidarum: Secondary | ICD-10-CM

## 2015-09-24 MED ORDER — PROMETHAZINE HCL 25 MG PO TABS: 25.0000 mg | ORAL_TABLET | Freq: Four times a day (QID) | ORAL | Status: DC | PRN

## 2015-09-30 ENCOUNTER — Encounter (HOSPITAL_COMMUNITY): Payer: Self-pay

## 2015-09-30 ENCOUNTER — Ambulatory Visit (HOSPITAL_COMMUNITY)
Admission: RE | Admit: 2015-09-30 | Discharge: 2015-09-30 | Disposition: A | Discharge status: Home / Self Care | Payer: Medicaid Other | Source: Ambulatory Visit | Attending: Obstetrics and Gynecology | Admitting: Obstetrics and Gynecology

## 2015-09-30 ENCOUNTER — Other Ambulatory Visit (HOSPITAL_COMMUNITY): Payer: Self-pay | Admitting: Maternal and Fetal Medicine

## 2015-09-30 DIAGNOSIS — O0933 Supervision of pregnancy with insufficient antenatal care, third trimester: Secondary | ICD-10-CM | POA: Insufficient documentation

## 2015-09-30 DIAGNOSIS — O09523 Supervision of elderly multigravida, third trimester: Secondary | ICD-10-CM

## 2015-09-30 DIAGNOSIS — O30103 Triplet pregnancy, unspecified number of placenta and unspecified number of amniotic sacs, third trimester: Secondary | ICD-10-CM

## 2015-09-30 DIAGNOSIS — O365931 Maternal care for other known or suspected poor fetal growth, third trimester, fetus 1: Secondary | ICD-10-CM

## 2015-09-30 DIAGNOSIS — O99333 Smoking (tobacco) complicating pregnancy, third trimester: Secondary | ICD-10-CM

## 2015-09-30 DIAGNOSIS — Z3A31 31 weeks gestation of pregnancy: Secondary | ICD-10-CM

## 2015-09-30 DIAGNOSIS — O0930 Supervision of pregnancy with insufficient antenatal care, unspecified trimester: Secondary | ICD-10-CM

## 2015-10-06 ENCOUNTER — Encounter: Payer: Medicaid Other | Admitting: Obstetrics and Gynecology

## 2015-10-07 ENCOUNTER — Ambulatory Visit (HOSPITAL_COMMUNITY)
Admission: RE | Admit: 2015-10-07 | Discharge: 2015-10-07 | Disposition: A | Discharge status: Home / Self Care | Payer: Medicaid Other | Source: Ambulatory Visit | Attending: Obstetrics and Gynecology | Admitting: Obstetrics and Gynecology

## 2015-10-07 ENCOUNTER — Inpatient Hospital Stay (HOSPITAL_COMMUNITY)
Admission: AD | Admit: 2015-10-07 | Discharge: 2015-10-09 | DRG: 766 | Disposition: A | Discharge status: Home / Self Care | Payer: Medicaid Other | Source: Ambulatory Visit | Attending: Obstetrics & Gynecology | Admitting: Obstetrics & Gynecology

## 2015-10-07 ENCOUNTER — Encounter (HOSPITAL_COMMUNITY)
Admission: AD | Disposition: A | Discharge status: Home / Self Care | Payer: Self-pay | Source: Ambulatory Visit | Attending: Obstetrics & Gynecology

## 2015-10-07 ENCOUNTER — Inpatient Hospital Stay (HOSPITAL_COMMUNITY): Payer: Medicaid Other | Admitting: Anesthesiology

## 2015-10-07 ENCOUNTER — Encounter (HOSPITAL_COMMUNITY): Payer: Self-pay

## 2015-10-07 ENCOUNTER — Other Ambulatory Visit (HOSPITAL_COMMUNITY): Payer: Self-pay | Admitting: Maternal and Fetal Medicine

## 2015-10-07 ENCOUNTER — Ambulatory Visit (INDEPENDENT_AMBULATORY_CARE_PROVIDER_SITE_OTHER): Payer: Medicaid Other | Admitting: Certified Nurse Midwife

## 2015-10-07 VITALS — BP 119/72 | HR 92 | Temp 98.4°F | Wt 149.0 lb

## 2015-10-07 DIAGNOSIS — O99333 Smoking (tobacco) complicating pregnancy, third trimester: Secondary | ICD-10-CM

## 2015-10-07 DIAGNOSIS — O365930 Maternal care for other known or suspected poor fetal growth, third trimester, not applicable or unspecified: Secondary | ICD-10-CM

## 2015-10-07 DIAGNOSIS — O26893 Other specified pregnancy related conditions, third trimester: Secondary | ICD-10-CM | POA: Diagnosis present

## 2015-10-07 DIAGNOSIS — O0930 Supervision of pregnancy with insufficient antenatal care, unspecified trimester: Secondary | ICD-10-CM

## 2015-10-07 DIAGNOSIS — Z3A32 32 weeks gestation of pregnancy: Secondary | ICD-10-CM

## 2015-10-07 DIAGNOSIS — O328XX0 Maternal care for other malpresentation of fetus, not applicable or unspecified: Secondary | ICD-10-CM | POA: Diagnosis present

## 2015-10-07 DIAGNOSIS — O0933 Supervision of pregnancy with insufficient antenatal care, third trimester: Secondary | ICD-10-CM

## 2015-10-07 DIAGNOSIS — O99334 Smoking (tobacco) complicating childbirth: Secondary | ICD-10-CM | POA: Diagnosis present

## 2015-10-07 DIAGNOSIS — O365931 Maternal care for other known or suspected poor fetal growth, third trimester, fetus 1: Secondary | ICD-10-CM | POA: Insufficient documentation

## 2015-10-07 DIAGNOSIS — O09523 Supervision of elderly multigravida, third trimester: Secondary | ICD-10-CM

## 2015-10-07 DIAGNOSIS — O328XX2 Maternal care for other malpresentation of fetus, fetus 2: Secondary | ICD-10-CM

## 2015-10-07 DIAGNOSIS — N879 Dysplasia of cervix uteri, unspecified: Secondary | ICD-10-CM | POA: Diagnosis present

## 2015-10-07 DIAGNOSIS — Z98891 History of uterine scar from previous surgery: Secondary | ICD-10-CM

## 2015-10-07 DIAGNOSIS — Z302 Encounter for sterilization: Secondary | ICD-10-CM

## 2015-10-07 DIAGNOSIS — O0993 Supervision of high risk pregnancy, unspecified, third trimester: Secondary | ICD-10-CM

## 2015-10-07 DIAGNOSIS — O30113 Triplet pregnancy with two or more monochorionic fetuses, third trimester: Secondary | ICD-10-CM | POA: Diagnosis present

## 2015-10-07 DIAGNOSIS — O328XX1 Maternal care for other malpresentation of fetus, fetus 1: Secondary | ICD-10-CM | POA: Diagnosis not present

## 2015-10-07 DIAGNOSIS — O328XX3 Maternal care for other malpresentation of fetus, fetus 3: Secondary | ICD-10-CM

## 2015-10-07 DIAGNOSIS — O30103 Triplet pregnancy, unspecified number of placenta and unspecified number of amniotic sacs, third trimester: Secondary | ICD-10-CM

## 2015-10-07 LAB — POCT URINALYSIS DIP (DEVICE)
Bilirubin Urine: NEGATIVE
Glucose, UA: NEGATIVE mg/dL
Hgb urine dipstick: NEGATIVE
Nitrite: NEGATIVE
Protein, ur: 100 mg/dL — AB
Specific Gravity, Urine: 1.02 (ref 1.005–1.030)
Urobilinogen, UA: 1 mg/dL (ref 0.0–1.0)
pH: 6.5 (ref 5.0–8.0)

## 2015-10-07 LAB — CBC
HCT: 30.8 % — ABNORMAL LOW (ref 36.0–46.0)
HEMOGLOBIN: 10 g/dL — AB (ref 12.0–15.0)
MCH: 24.8 pg — AB (ref 26.0–34.0)
MCHC: 32.5 g/dL (ref 30.0–36.0)
MCV: 76.2 fL — AB (ref 78.0–100.0)
Platelets: 239 10*3/uL (ref 150–400)
RBC: 4.04 MIL/uL (ref 3.87–5.11)
RDW: 15.3 % (ref 11.5–15.5)
WBC: 8.1 10*3/uL (ref 4.0–10.5)

## 2015-10-07 LAB — TYPE AND SCREEN
ABO/RH(D): B POS
Antibody Screen: NEGATIVE

## 2015-10-07 LAB — ABO/RH: ABO/RH(D): B POS

## 2015-10-07 SURGERY — Surgical Case
Anesthesia: Spinal

## 2015-10-07 MED ORDER — SIMETHICONE 80 MG PO CHEW: 80.0000 mg | CHEWABLE_TABLET | ORAL | Status: DC | PRN

## 2015-10-07 MED ORDER — ACETAMINOPHEN 325 MG PO TABS
650.0000 mg | ORAL_TABLET | ORAL | Status: DC | PRN
  Administered 2015-10-09: 650 mg via ORAL
  Filled 2015-10-07: qty 2

## 2015-10-07 MED ORDER — DIPHENHYDRAMINE HCL 25 MG PO CAPS: 25.0000 mg | ORAL_CAPSULE | ORAL | Status: DC | PRN

## 2015-10-07 MED ORDER — WITCH HAZEL-GLYCERIN EX PADS
1.0000 | MEDICATED_PAD | CUTANEOUS | Status: DC | PRN
Start: 2015-10-07 — End: 2015-10-09

## 2015-10-07 MED ORDER — LACTATED RINGERS IV SOLN
INTRAVENOUS | Status: DC
  Administered 2015-10-07 – 2015-10-08 (×2): via INTRAVENOUS

## 2015-10-07 MED ORDER — KETOROLAC TROMETHAMINE 30 MG/ML IJ SOLN
INTRAMUSCULAR | Status: AC
  Administered 2015-10-07: 30 mg via INTRAMUSCULAR
  Filled 2015-10-07: qty 1

## 2015-10-07 MED ORDER — NALBUPHINE HCL 10 MG/ML IJ SOLN: 5.0000 mg | Freq: Once | INTRAMUSCULAR | Status: DC | PRN

## 2015-10-07 MED ORDER — SIMETHICONE 80 MG PO CHEW
80.0000 mg | CHEWABLE_TABLET | ORAL | Status: DC
  Administered 2015-10-07 – 2015-10-09 (×2): 80 mg via ORAL
  Filled 2015-10-07 (×2): qty 1

## 2015-10-07 MED ORDER — FERROUS SULFATE 325 (65 FE) MG PO TABS
325.0000 mg | ORAL_TABLET | Freq: Two times a day (BID) | ORAL | Status: DC
  Administered 2015-10-08 – 2015-10-09 (×3): 325 mg via ORAL
  Filled 2015-10-07 (×3): qty 1

## 2015-10-07 MED ORDER — NALBUPHINE HCL 10 MG/ML IJ SOLN: 5.0000 mg | INTRAMUSCULAR | Status: DC | PRN

## 2015-10-07 MED ORDER — FENTANYL CITRATE (PF) 100 MCG/2ML IJ SOLN
INTRAMUSCULAR | Status: AC
  Filled 2015-10-07: qty 2

## 2015-10-07 MED ORDER — SODIUM CHLORIDE 0.9 % IJ SOLN: 3.0000 mL | INTRAMUSCULAR | Status: DC | PRN

## 2015-10-07 MED ORDER — LANOLIN HYDROUS EX OINT: 1.0000 "application " | TOPICAL_OINTMENT | CUTANEOUS | Status: DC | PRN

## 2015-10-07 MED ORDER — OXYTOCIN 10 UNIT/ML IJ SOLN
40.0000 [IU] | INTRAVENOUS | Status: DC | PRN
  Administered 2015-10-07: 40 [IU] via INTRAVENOUS

## 2015-10-07 MED ORDER — TERBUTALINE SULFATE 1 MG/ML IJ SOLN
INTRAMUSCULAR | Status: AC
  Filled 2015-10-07: qty 1

## 2015-10-07 MED ORDER — BUPIVACAINE HCL (PF) 0.5 % IJ SOLN
INTRAMUSCULAR | Status: AC
  Filled 2015-10-07: qty 30

## 2015-10-07 MED ORDER — PHENYLEPHRINE HCL 10 MG/ML IJ SOLN
INTRAMUSCULAR | Status: DC | PRN
  Administered 2015-10-07: 80 ug via INTRAVENOUS
  Administered 2015-10-07: 40 ug via INTRAVENOUS
  Administered 2015-10-07 (×3): 80 ug via INTRAVENOUS

## 2015-10-07 MED ORDER — MEPERIDINE HCL 25 MG/ML IJ SOLN: 6.2500 mg | INTRAMUSCULAR | Status: DC | PRN

## 2015-10-07 MED ORDER — FENTANYL CITRATE (PF) 100 MCG/2ML IJ SOLN
INTRAMUSCULAR | Status: DC | PRN
  Administered 2015-10-07: 10 ug via INTRATHECAL
  Administered 2015-10-07: 90 ug via INTRAVENOUS

## 2015-10-07 MED ORDER — TETANUS-DIPHTH-ACELL PERTUSSIS 5-2.5-18.5 LF-MCG/0.5 IM SUSP: 0.5000 mL | Freq: Once | INTRAMUSCULAR | Status: DC

## 2015-10-07 MED ORDER — PRENATAL MULTIVITAMIN CH
1.0000 | ORAL_TABLET | Freq: Every day | ORAL | Status: DC
  Administered 2015-10-08 – 2015-10-09 (×2): 1 via ORAL
  Filled 2015-10-07 (×2): qty 1

## 2015-10-07 MED ORDER — OXYTOCIN 40 UNITS IN LACTATED RINGERS INFUSION - SIMPLE MED: 62.5000 mL/h | INTRAVENOUS | Status: AC

## 2015-10-07 MED ORDER — FAMOTIDINE IN NACL 20-0.9 MG/50ML-% IV SOLN
20.0000 mg | Freq: Once | INTRAVENOUS | Status: AC
  Administered 2015-10-07: 20 mg via INTRAVENOUS
  Filled 2015-10-07: qty 50

## 2015-10-07 MED ORDER — DEXAMETHASONE SODIUM PHOSPHATE 10 MG/ML IJ SOLN
INTRAMUSCULAR | Status: AC
  Filled 2015-10-07: qty 1

## 2015-10-07 MED ORDER — MAGNESIUM HYDROXIDE 400 MG/5ML PO SUSP: 30.0000 mL | ORAL | Status: DC | PRN

## 2015-10-07 MED ORDER — CEFAZOLIN SODIUM-DEXTROSE 2-3 GM-% IV SOLR
2.0000 g | INTRAVENOUS | Status: AC
  Administered 2015-10-07: 2 g via INTRAVENOUS
  Filled 2015-10-07: qty 50

## 2015-10-07 MED ORDER — PHENYLEPHRINE 8 MG IN D5W 100 ML (0.08MG/ML) PREMIX OPTIME
INJECTION | INTRAVENOUS | Status: AC
  Filled 2015-10-07: qty 100

## 2015-10-07 MED ORDER — DIPHENHYDRAMINE HCL 50 MG/ML IJ SOLN: 12.5000 mg | INTRAMUSCULAR | Status: DC | PRN

## 2015-10-07 MED ORDER — DEXTROSE 5 % IV SOLN: 1.0000 ug/kg/h | INTRAVENOUS | Status: DC | PRN

## 2015-10-07 MED ORDER — NALOXONE HCL 0.4 MG/ML IJ SOLN: 0.4000 mg | INTRAMUSCULAR | Status: DC | PRN

## 2015-10-07 MED ORDER — PROMETHAZINE HCL 25 MG/ML IJ SOLN: 6.2500 mg | INTRAMUSCULAR | Status: DC | PRN

## 2015-10-07 MED ORDER — BUPIVACAINE HCL (PF) 0.5 % IJ SOLN
INTRAMUSCULAR | Status: DC | PRN
  Administered 2015-10-07: 30 mL

## 2015-10-07 MED ORDER — SENNOSIDES-DOCUSATE SODIUM 8.6-50 MG PO TABS
2.0000 | ORAL_TABLET | ORAL | Status: DC
  Administered 2015-10-07 – 2015-10-09 (×2): 2 via ORAL
  Filled 2015-10-07 (×2): qty 2

## 2015-10-07 MED ORDER — DEXAMETHASONE SODIUM PHOSPHATE 10 MG/ML IJ SOLN
INTRAMUSCULAR | Status: DC | PRN
  Administered 2015-10-07: 10 mg via INTRAVENOUS

## 2015-10-07 MED ORDER — TERBUTALINE SULFATE 1 MG/ML IJ SOLN
0.2500 mg | Freq: Once | INTRAMUSCULAR | Status: AC
  Administered 2015-10-07: 0.25 mg via SUBCUTANEOUS

## 2015-10-07 MED ORDER — LACTATED RINGERS IV SOLN
INTRAVENOUS | Status: DC
  Administered 2015-10-07 (×2): via INTRAVENOUS

## 2015-10-07 MED ORDER — SCOPOLAMINE 1 MG/3DAYS TD PT72
MEDICATED_PATCH | TRANSDERMAL | Status: DC | PRN
  Administered 2015-10-07: 1 via TRANSDERMAL

## 2015-10-07 MED ORDER — ACETAMINOPHEN 500 MG PO TABS
1000.0000 mg | ORAL_TABLET | Freq: Four times a day (QID) | ORAL | Status: AC
  Administered 2015-10-07 – 2015-10-08 (×3): 1000 mg via ORAL
  Filled 2015-10-07 (×4): qty 2

## 2015-10-07 MED ORDER — OXYCODONE-ACETAMINOPHEN 5-325 MG PO TABS
2.0000 | ORAL_TABLET | ORAL | Status: DC | PRN
  Administered 2015-10-08: 2 via ORAL
  Filled 2015-10-07 (×2): qty 2

## 2015-10-07 MED ORDER — LACTATED RINGERS IV BOLUS (SEPSIS): 1000.0000 mL | Freq: Once | INTRAVENOUS | Status: DC

## 2015-10-07 MED ORDER — KETOROLAC TROMETHAMINE 30 MG/ML IJ SOLN
30.0000 mg | Freq: Four times a day (QID) | INTRAMUSCULAR | Status: DC | PRN
Start: 2015-10-07 — End: 2015-10-07

## 2015-10-07 MED ORDER — IBUPROFEN 600 MG PO TABS
600.0000 mg | ORAL_TABLET | Freq: Four times a day (QID) | ORAL | Status: DC
  Administered 2015-10-07 – 2015-10-09 (×7): 600 mg via ORAL
  Filled 2015-10-07 (×7): qty 1

## 2015-10-07 MED ORDER — ONDANSETRON HCL 4 MG/2ML IJ SOLN
INTRAMUSCULAR | Status: DC | PRN
  Administered 2015-10-07: 4 mg via INTRAVENOUS

## 2015-10-07 MED ORDER — DIBUCAINE 1 % RE OINT
1.0000 | TOPICAL_OINTMENT | RECTAL | Status: DC | PRN
Start: 2015-10-07 — End: 2015-10-09

## 2015-10-07 MED ORDER — MEASLES, MUMPS & RUBELLA VAC ~~LOC~~ INJ: 0.5000 mL | INJECTION | Freq: Once | SUBCUTANEOUS | Status: DC

## 2015-10-07 MED ORDER — DIPHENHYDRAMINE HCL 25 MG PO CAPS: 25.0000 mg | ORAL_CAPSULE | Freq: Four times a day (QID) | ORAL | Status: DC | PRN

## 2015-10-07 MED ORDER — OXYTOCIN 10 UNIT/ML IJ SOLN
INTRAMUSCULAR | Status: AC
  Filled 2015-10-07: qty 4

## 2015-10-07 MED ORDER — CITRIC ACID-SODIUM CITRATE 334-500 MG/5ML PO SOLN
30.0000 mL | Freq: Once | ORAL | Status: AC
  Administered 2015-10-07: 30 mL via ORAL
  Filled 2015-10-07: qty 15

## 2015-10-07 MED ORDER — MORPHINE SULFATE (PF) 0.5 MG/ML IJ SOLN
INTRAMUSCULAR | Status: DC | PRN
  Administered 2015-10-07: .2 mg via INTRATHECAL

## 2015-10-07 MED ORDER — MORPHINE SULFATE (PF) 0.5 MG/ML IJ SOLN
INTRAMUSCULAR | Status: AC
  Filled 2015-10-07: qty 10

## 2015-10-07 MED ORDER — FERROUS SULFATE 325 (65 FE) MG PO TABS: 325.0000 mg | ORAL_TABLET | Freq: Two times a day (BID) | ORAL | Status: DC

## 2015-10-07 MED ORDER — SCOPOLAMINE 1 MG/3DAYS TD PT72
MEDICATED_PATCH | TRANSDERMAL | Status: AC
  Filled 2015-10-07: qty 1

## 2015-10-07 MED ORDER — LACTATED RINGERS IV SOLN
INTRAVENOUS | Status: DC | PRN
  Administered 2015-10-07: 18:00:00 via INTRAVENOUS

## 2015-10-07 MED ORDER — ONDANSETRON HCL 4 MG/2ML IJ SOLN
INTRAMUSCULAR | Status: AC
  Filled 2015-10-07: qty 2

## 2015-10-07 MED ORDER — PHENYLEPHRINE HCL 10 MG/ML IJ SOLN
20.0000 mg | INTRAVENOUS | Status: DC | PRN
  Administered 2015-10-07: 60 ug/min via INTRAVENOUS

## 2015-10-07 MED ORDER — ZOLPIDEM TARTRATE 5 MG PO TABS
5.0000 mg | ORAL_TABLET | Freq: Every evening | ORAL | Status: DC | PRN
  Filled 2015-10-07: qty 1

## 2015-10-07 MED ORDER — ONDANSETRON HCL 4 MG/2ML IJ SOLN: 4.0000 mg | Freq: Three times a day (TID) | INTRAMUSCULAR | Status: DC | PRN

## 2015-10-07 MED ORDER — BUPIVACAINE IN DEXTROSE 0.75-8.25 % IT SOLN
INTRATHECAL | Status: DC | PRN
  Administered 2015-10-07: 1.7 mg via INTRATHECAL

## 2015-10-07 MED ORDER — OXYCODONE-ACETAMINOPHEN 5-325 MG PO TABS
1.0000 | ORAL_TABLET | ORAL | Status: DC | PRN
  Administered 2015-10-08: 1 via ORAL
  Filled 2015-10-07: qty 1

## 2015-10-07 MED ORDER — KETOROLAC TROMETHAMINE 30 MG/ML IJ SOLN
30.0000 mg | Freq: Four times a day (QID) | INTRAMUSCULAR | Status: DC | PRN
  Administered 2015-10-07: 30 mg via INTRAMUSCULAR

## 2015-10-07 MED ORDER — MENTHOL 3 MG MT LOZG: 1.0000 | LOZENGE | OROMUCOSAL | Status: DC | PRN

## 2015-10-07 SURGICAL SUPPLY — 38 items
APL SKNCLS STERI-STRIP NONHPOA (GAUZE/BANDAGES/DRESSINGS) ×1
BENZOIN TINCTURE PRP APPL 2/3 (GAUZE/BANDAGES/DRESSINGS) ×2 IMPLANT
CLAMP CORD UMBIL (MISCELLANEOUS) IMPLANT
CLIP FILSHIE TUBAL LIGA STRL (Clip) ×4 IMPLANT
CLOSURE WOUND 1/2 X4 (GAUZE/BANDAGES/DRESSINGS) ×1
CLOTH BEACON ORANGE TIMEOUT ST (SAFETY) ×3 IMPLANT
DRAPE SHEET LG 3/4 BI-LAMINATE (DRAPES) IMPLANT
DRSG OPSITE POSTOP 4X10 (GAUZE/BANDAGES/DRESSINGS) ×3 IMPLANT
DURAPREP 26ML APPLICATOR (WOUND CARE) ×3 IMPLANT
ELECT REM PT RETURN 9FT ADLT (ELECTROSURGICAL) ×3
ELECTRODE REM PT RTRN 9FT ADLT (ELECTROSURGICAL) ×1 IMPLANT
EXTRACTOR VACUUM M CUP 4 TUBE (SUCTIONS) IMPLANT
EXTRACTOR VACUUM M CUP 4' TUBE (SUCTIONS)
GLOVE BIOGEL PI IND STRL 7.0 (GLOVE) ×3 IMPLANT
GLOVE BIOGEL PI INDICATOR 7.0 (GLOVE) ×6
GLOVE ECLIPSE 7.0 STRL STRAW (GLOVE) ×3 IMPLANT
GOWN STRL REUS W/TWL LRG LVL3 (GOWN DISPOSABLE) ×6 IMPLANT
KIT ABG SYR 3ML LUER SLIP (SYRINGE) IMPLANT
NDL HYPO 25X5/8 SAFETYGLIDE (NEEDLE) ×1 IMPLANT
NEEDLE HYPO 22GX1.5 SAFETY (NEEDLE) ×3 IMPLANT
NEEDLE HYPO 25X5/8 SAFETYGLIDE (NEEDLE) ×3 IMPLANT
NS IRRIG 1000ML POUR BTL (IV SOLUTION) ×3 IMPLANT
PACK C SECTION WH (CUSTOM PROCEDURE TRAY) ×3 IMPLANT
PAD ABD 7.5X8 STRL (GAUZE/BANDAGES/DRESSINGS) ×3 IMPLANT
PAD OB MATERNITY 4.3X12.25 (PERSONAL CARE ITEMS) ×3 IMPLANT
PENCIL SMOKE EVAC W/HOLSTER (ELECTROSURGICAL) ×3 IMPLANT
RTRCTR C-SECT PINK 25CM LRG (MISCELLANEOUS) IMPLANT
STRIP CLOSURE SKIN 1/2X4 (GAUZE/BANDAGES/DRESSINGS) ×1 IMPLANT
SUT PDS AB 0 CT1 27 (SUTURE) IMPLANT
SUT PDS AB 0 CTX 36 PDP370T (SUTURE) IMPLANT
SUT PLAIN 2 0 XLH (SUTURE) IMPLANT
SUT VIC AB 0 CT1 36 (SUTURE) ×6 IMPLANT
SUT VIC AB 0 CTX 36 (SUTURE) ×6
SUT VIC AB 0 CTX36XBRD ANBCTRL (SUTURE) ×2 IMPLANT
SUT VIC AB 4-0 KS 27 (SUTURE) ×3 IMPLANT
SYR CONTROL 10ML LL (SYRINGE) ×3 IMPLANT
TOWEL OR 17X24 6PK STRL BLUE (TOWEL DISPOSABLE) ×3 IMPLANT
TRAY FOLEY CATH SILVER 14FR (SET/KITS/TRAYS/PACK) ×3 IMPLANT

## 2015-10-07 NOTE — Op Note (Signed)
Marissa Whitaker PROCEDURE DATE: 10/07/2015  PREOPERATIVE DIAGNOSES: Intrauterine pregnancy at [redacted]w[redacted]d weeks gestation; triplets; active preterm labor; undesired fertility  POSTOPERATIVE DIAGNOSES: The same  PROCEDURE: Primary Low Transverse Cesarean Section, Bilateral Tubal Sterilization using Filshie clips  SURGEON:  Dr. Jaynie Collins  ASSISTANT:  Dr. Lorain Childes  ANESTHESIOLOGIST: Dr. Arrie Aran  INDICATIONS: Marissa Whitaker is a 39 y.o. [id number] at [redacted]w[redacted]d here for urgent cesarean section and bilateral tubal sterilization secondary to the indications listed under preoperative diagnoses; please see preoperative notes for further details.  The risks of surgery were discussed with the patient including but were not limited to: bleeding which may require transfusion or reoperation; infection which may require antibiotics; injury to bowel, bladder, ureters or other surrounding organs; injury to the fetus; need for additional procedures including hysterectomy in the event of a life-threatening hemorrhage; placental abnormalities wth subsequent pregnancies, incisional problems, thromboembolic phenomenon and other postoperative/anesthesia complications.  Patient also desires permanent sterilization.  Other reversible forms of contraception were discussed with patient; she declines all other modalities. Risks of procedure discussed with patient including but not limited to: risk of regret, permanence of method, bleeding, infection, injury to surrounding organs and need for additional procedures.  Failure risk of 1-2% with increased risk of ectopic gestation if pregnancy occurs was also discussed with patient.  The patient concurred with the proposed plan, giving informed written consent for the procedures.    FINDINGS:  Viable female infants delivered in cephalic/complete breech/footling breech presentations.  Apgars were pending at the time of this documentation; please refer to delivery summary.  Clear  amniotic fluid x 3.  Intact placenta and three vessel cord x 3.  Normal uterus, fallopian tubes and ovaries bilaterally. Fallopian tubes sterilized with Filshie clips bilaterally.  ANESTHESIA: Spinal INTRAVENOUS FLUIDS: 2400 ml ESTIMATED BLOOD LOSS: 800 ml URINE OUTPUT:  100 ml SPECIMENS: Placenta sent to pathology COMPLICATIONS: None immediate  PROCEDURE IN DETAIL:  The patient preoperatively received intravenous antibiotics and had sequential compression devices applied to her lower extremities.   She was then taken to the operating room where spinal anesthesia was administered and was found to be adequate. She was then placed in a dorsal supine position with a leftward tilt, and prepped and draped in a sterile manner.  A foley catheter was placed into her bladder and attached to constant gravity.  After an adequate timeout was performed, a Pfannenstiel skin incision was made with scalpel and carried through to the underlying layer of fascia. The fascia was incised in the midline, and this incision was extended bilaterally using the Mayo scissors.  Kocher clamps were applied to the superior aspect of the fascial incision and the underlying rectus muscles were dissected off bluntly. A similar process was carried out on the inferior aspect of the fascial incision. The rectus muscles were separated in the midline bluntly and the peritoneum was entered bluntly. Attention was turned to the lower uterine segment where a low transverse hysterotomy was made with a scalpel and extended bilaterally bluntly.  The infants were sequentially delivered in the aforementioned presentations, each cord was clamped and cut after one minute, and the infants were handed over to awaiting neonatology team. Uterine massage was then administered, and the placenta delivered intact with a three-vessel cord. The uterus was then cleared of clot and debris.  The hysterotomy was closed with 0 Vicryl in a running locked fashion, and an  imbricating layer was also placed with 0 Vicryl.   Attention was then turned to the  fallopian tubes, and Filshie clips were placed about 3 cm from the cornua, with care given to incorporate the underlying mesosalpinx on both sides, allowing for bilateral tubal sterilization. The pelvis was cleared of all clot and debris. Hemostasis was confirmed on all surfaces.  The peritoneum and the muscles were reapproximated using 0 Vicryl interrupted stitches. The fascia was then closed using 0 Vicryl in a running fashion.  The subcutaneous layer was irrigated,  and 30 ml of 0.5% Marcaine was injected subcutaneously around the incision.  The skin was closed with a 4-0 Vicryl subcuticular stitch. The patient tolerated the procedure well. Sponge, lap, instrument and needle counts were correct x 3.  She was taken to the recovery room in stable condition.     Jaynie CollinsUGONNA  ANYANWU, MD, FACOG Attending Obstetrician & Gynecologist Faculty Practice, [facility] [place]

## 2015-10-07 NOTE — Patient Instructions (Signed)

## 2015-10-07 NOTE — Progress Notes (Signed)
Subjective:  Marissa Whitaker is a 39 y.o. G7P5015 at 3452w3d being seen today for ongoing prenatal care.  She is currently monitored for the following issues for this high-risk pregnancy and has Advanced maternal age in multigravida; Tobacco use during pregnancy; Supervision of high-risk pregnancy; Triplet gestation; Dysplasia of cervix, low grade (CIN 1); and Threatened preterm labor on her problem list.  Patient reports contractions since earlier today.  Contractions: Regular. Vag. Bleeding: None.  Movement: Present. Denies leaking of fluid.   The following portions of the patient's history were reviewed and updated as appropriate: allergies, current medications, past family history, past medical history, past social history, past surgical history and problem list. Problem list updated.  Objective:   Filed Vitals:   10/07/15 1628  BP: 119/72  Pulse: 92  Temp: 98.4 F (36.9 C)  Weight: 149 lb (67.586 kg)    Fetal Status:     Movement: Present     General:  Alert, oriented and cooperative. Patient is in no acute distress.  Skin: Skin is warm and dry. No rash noted.   Cardiovascular: Normal heart rate noted  Respiratory: Normal respiratory effort, no problems with respiration noted  Abdomen: Soft, gravid, appropriate for gestational age. Pain/Pressure: Present     Pelvic: Vag. Bleeding: None Vag D/C Character: Mucous   Cervical exam performed        Extremities: Normal range of motion.  Edema: None  Mental Status: Normal mood and affect. Normal behavior. Normal judgment and thought content.   Urinalysis:      Assessment and Plan:  Pregnancy: [id number] at 7552w3d  There are no diagnoses linked to this encounter. Preterm labor symptoms and general obstetric precautions including but not limited to vaginal bleeding, contractions, leaking of fluid and fetal movement were reviewed in detail with the patient. Please refer to After Visit Summary for other counseling recommendations.  No  Follow-up on file. Sent to MAU. Dr. Alvester MorinNewton notified.  Marissa Whitaker, CNM

## 2015-10-07 NOTE — MAU Note (Signed)
Pt presents for evaluation of contractions and possible labor. Dilated 3cm in office today. Triplets. Denies bleeding or leaking. Reports good fetal movement.

## 2015-10-07 NOTE — H&P (Signed)
OBSTETRIC ADMISSION HISTORY AND PHYSICAL  Marissa Whitaker is a 39 y.o. female [phone] with IUP at [redacted]w[redacted]d by 19 presenting for contractions. She was seen in slinic and found to be 3 cm. Contractions started 1 week ago. She reports +FMs, No LOF, no VB, no blurry vision, headaches or peripheral edema, and RUQ pain.  She plans on bottle/breast feeding. She request BTL for birth control.  Pregnancy complicated by 1) Tri/Tri gestation 2) AMA 3) Tobacco use 4) Late entry PNC 5) Limited PNC (only 4 visits, including today)  Dating: By 37 --->  Estimated Date of Delivery: 11/29/15  Sono:   Multiple [place] for triplet gestation   Prenatal History/Complications:  Clinic  [facility] Prenatal Labs  Dating 19 wk u/s Blood type: B/Positive/-- (08/31 0000)   Genetic Screen  detailed anatomy wnl, declined amnio Antibody:Negative (08/31 0000)  Anatomic US  wnl, triplets Rubella: Immune (08/31 0000)  GTT Early:               Third trimester: 107 RPR: Nonreactive, Nonreactive (08/31 0000)   Flu vaccine  09/01/2015 HBsAg: Negative (08/31 0000)   TDaP vaccine   09/01/2015                                 HIV: Non-reactive (08/31 0000)   Baby Food     Unknown                                          GBS: (For PCN allergy, check sensitivities)  Contraception BTL Pap: ASCUS per patient 2016, records requested-biopsy showed CIN1  Circumcision    Pediatrician    Support Person      Past Medical History: Past Medical History  Diagnosis Date  . Advanced maternal age in multigravida   . Triplet gestation     Past Surgical History: Past Surgical History  Procedure Laterality Date  . Tonsillectomy      Obstetrical History: OB History    Gravida Para Term Preterm AB TAB SAB Ectopic Multiple Living   0 1 0 1 0 0 5      Social History: Social History   Social History  . Marital Status: Single    Spouse Name: N/A  . Number of Children: N/A  . Years of Education: N/A   Social History Main Topics  .  Smoking status: Current Every Day Smoker  . Smokeless tobacco: None  . Alcohol Use: No  . Drug Use: No  . Sexual Activity: Not Asked   Other Topics Concern  . None   Social History Narrative    Family History: History reviewed. No pertinent family history.  Allergies: No Known Allergies  Prescriptions prior to admission  Medication Sig Dispense Refill Last Dose  . aspirin EC 81 MG tablet Take 1 tablet (81 mg total) by mouth daily. Take after 12 weeks for prevention of preeclampsia later in pregnancy 300 tablet 2 Taking  . ferrous sulfate 325 (65 FE) MG tablet Take 1 tablet (325 mg total) by mouth daily with breakfast. 90 tablet 3 Taking  . NIFEdipine (PROCARDIA XL) 30 MG 24 hr tablet Take 1 tablet (30 mg total) by mouth 2 (two) times daily. 60 tablet 1 Taking  . Prenatal Vit-Fe Fumarate-FA (PRENATAL MULTIVITAMIN) TABS tablet Take 1 tablet by mouth daily at 12  noon.   Taking  . promethazine (PHENERGAN) 25 MG tablet Take 1 tablet (25 mg total) by mouth every 6 (six) hours as needed for nausea or vomiting. 30 tablet 2 Taking     Review of Systems   All systems reviewed and negative except as stated in HPI  Blood pressure 122/68, pulse 90, temperature 98.6 F (37 C), temperature source Oral, resp. rate 18, last menstrual period 04/04/2015. General appearance: alert, cooperative and appears stated age Lungs: clear to auscultation bilaterally Heart: regular rate and rhythm Abdomen: soft, non-tender; bowel sounds normal Pelvic: adequate Extremities: Homans sign is negative, no sign of DVT  Presentation: cephalic Fetal monitoring Fetus A 115/mod/+accels Fetus B 120/mod/+accels Fetus C 125/mod/+accels  Uterine activityFrequency: Every 5-8 minutes Dilation: 5 Effacement (%): Thick Exam by:: Dr. Alvester MorinNewton   Prenatal labs: ABO, Rh: B/Positive/-- (08/31 0000) Antibody: Negative (08/31 0000) Rubella: !Error! RPR: NON REAC (10/24 1045)  HBsAg: Negative (08/31 0000)  HIV:  NONREACTIVE (10/24 1045)  GBS:     Prenatal Transfer Tool  Maternal Diabetes: No Genetic Screening: Declined Maternal Ultrasounds/Referrals: Normal Fetal Ultrasounds or other Referrals:  None Maternal Substance Abuse:  Yes:  Type: Smoker Significant Maternal Medications:  Meds include: Other: ASA Significant Maternal Lab Results: Lab values include: Other: GBS Unknown  Results for orders placed or performed in visit on 10/07/15 (from the past 24 hour(s))  POCT urinalysis dip (device)   Collection Time: 10/07/15  5:13 PM  Result Value Ref Range   Glucose, UA NEGATIVE NEGATIVE mg/dL   Bilirubin Urine NEGATIVE NEGATIVE   Ketones, ur TRACE (A) NEGATIVE mg/dL   Specific Gravity, Urine 1.020 1.005 - 1.030   Hgb urine dipstick NEGATIVE NEGATIVE   pH 6.5 5.0 - 8.0   Protein, ur 100 (A) NEGATIVE mg/dL   Urobilinogen, UA 1.0 0.0 - 1.0 mg/dL   Nitrite NEGATIVE NEGATIVE   Leukocytes, UA TRACE (A) NEGATIVE    Patient Active Problem List   Diagnosis Date Noted  . Threatened preterm labor 09/22/2015  . Dysplasia of cervix, low grade (CIN 1) 09/03/2015  . Tobacco use during pregnancy 09/01/2015  . Supervision of high-risk pregnancy 09/01/2015  . Triplet gestation 09/01/2015  . Advanced maternal age in multigravida 07/10/2015    Assessment: Marissa Whitaker is a 39 y.o. [phone] at 3450w3d here for preterm labor  The risks of cesarean section were discussed with the patient including but were not limited to: bleeding which may require transfusion or reoperation; infection which may require antibiotics; injury to bowel, bladder, ureters or other surrounding organs; injury to the fetus; need for additional procedures including hysterectomy in the event of a life-threatening hemorrhage; placental abnormalities wth subsequent pregnancies, incisional problems, thromboembolic phenomenon and other postoperative/anesthesia complications.  Patient also desires permanent sterilization.  Other reversible forms  of contraception were discussed with patient; she declines all other modalities. Risks of procedure discussed with patient including but not limited to: risk of regret, permanence of method, bleeding, infection, injury to surrounding organs and need for additional procedures.  Failure risk of 1-2% with increased risk of ectopic gestation if pregnancy occurs was also discussed with patient.  The patient concurred with the proposed plan, giving informed written consent for the procedures.  Patient has been NPO since breakfast (~8 AM) she will remain NPO for procedure. Anesthesia and OR aware.  Preoperative prophylactic antibiotics and SCDs ordered on call to the OR.  To OR when ready.  Isa RankinKimberly Niles [facility] 10/07/2015, 5:32 PM

## 2015-10-07 NOTE — Anesthesia Preprocedure Evaluation (Addendum)
Anesthesia Evaluation  Patient identified by MRN, date of birth, ID band Patient awake    Reviewed: Allergy & Precautions, NPO status , Patient's Chart, lab work & pertinent test results  History of Anesthesia Complications Negative for: history of anesthetic complications  Airway Mallampati: II  TM Distance: >3 FB Neck ROM: Full    Dental  (+) Dental Advisory Given, Poor Dentition, Missing, Chipped   Pulmonary Current Smoker,    Pulmonary exam normal breath sounds clear to auscultation       Cardiovascular Exercise Tolerance: Good negative cardio ROS Normal cardiovascular exam Rhythm:Regular Rate:Normal     Neuro/Psych negative neurological ROS  negative psych ROS   GI/Hepatic negative GI ROS, Neg liver ROS,   Endo/Other  negative endocrine ROS  Renal/GU negative Renal ROS     Musculoskeletal negative musculoskeletal ROS (+)   Abdominal   Peds  Hematology negative hematology ROS (+)   Anesthesia Other Findings Day of surgery medications reviewed with the patient.  Reproductive/Obstetrics (+) Pregnancy [id number] with IUP (Triplets) at 7040w3d                            Anesthesia Physical Anesthesia Plan  ASA: II  Anesthesia Plan: Spinal   Post-op Pain Management:    Induction:   Airway Management Planned:   Additional Equipment:   Intra-op Plan:   Post-operative Plan:   Informed Consent: I have reviewed the patients History and Physical, chart, labs and discussed the procedure including the risks, benefits and alternatives for the proposed anesthesia with the patient or authorized representative who has indicated his/her understanding and acceptance.   Dental advisory given  Plan Discussed with: CRNA, Anesthesiologist and Surgeon  Anesthesia Plan Comments: (Discussed risks and benefits of and differences between spinal and general. Discussed risks of spinal including  headache, backache, failure, bleeding, infection, and nerve damage. Patient consents to spinal. Questions answered. Coagulation studies and platelet count acceptable.)        Anesthesia Quick Evaluation

## 2015-10-07 NOTE — Anesthesia Procedure Notes (Signed)
Spinal Patient location during procedure: OR Staffing Anesthesiologist: Rocklin Soderquist EDWARD Performed by: anesthesiologist  Preanesthetic Checklist Completed: patient identified, surgical consent, pre-op evaluation, timeout performed, IV checked, risks and benefits discussed and monitors and equipment checked Spinal Block Patient position: sitting Prep: site prepped and draped and DuraPrep Patient monitoring: continuous pulse ox and blood pressure Approach: midline Location: L3-4 Needle Needle type: Pencan  Needle gauge: 24 G Additional Notes Functioning IV was confirmed and monitors were applied. Sterile prep and drape, including hand hygiene, mask and sterile gloves were used. The patient was positioned and the spine was prepped. The skin was anesthetized with lidocaine.  Free flow of clear CSF was obtained prior to injecting local anesthetic into the CSF.  The spinal needle aspirated freely following injection.  The needle was carefully withdrawn.  The patient tolerated the procedure well. Consent was obtained prior to procedure with all questions answered and concerns addressed. Risks including but not limited to bleeding, infection, nerve damage, paralysis, failed block, inadequate analgesia, allergic reaction, high spinal, itching and headache were discussed and the patient wished to proceed.   Liviah Cake, MD   

## 2015-10-07 NOTE — Transfer of Care (Signed)
Immediate Anesthesia Transfer of Care Note  Patient: Marissa Whitaker  Procedure(s) Performed: Procedure(s): CESAREAN SECTION (N/A)  Patient Location: PACU  Anesthesia Type:Spinal  Level of Consciousness: awake, alert  and oriented  Airway & Oxygen Therapy: Patient Spontanous Breathing  Post-op Assessment: Report given to RN and Post -op Vital signs reviewed and stable  Post vital signs: Reviewed and stable  Last Vitals:  Filed Vitals:   10/07/15 1718  BP: 122/68  Pulse: 90  Temp: 37 C  Resp: 18    Complications: No apparent anesthesia complications

## 2015-10-08 ENCOUNTER — Encounter (HOSPITAL_COMMUNITY): Payer: Self-pay | Admitting: *Deleted

## 2015-10-08 LAB — CBC
HCT: 27.6 % — ABNORMAL LOW (ref 36.0–46.0)
HEMOGLOBIN: 9 g/dL — AB (ref 12.0–15.0)
MCH: 24.6 pg — ABNORMAL LOW (ref 26.0–34.0)
MCHC: 32.6 g/dL (ref 30.0–36.0)
MCV: 75.4 fL — ABNORMAL LOW (ref 78.0–100.0)
Platelets: 187 10*3/uL (ref 150–400)
RBC: 3.66 MIL/uL — AB (ref 3.87–5.11)
RDW: 15.1 % (ref 11.5–15.5)
WBC: 13 10*3/uL — AB (ref 4.0–10.5)

## 2015-10-08 LAB — RPR: RPR: NONREACTIVE

## 2015-10-08 MED ORDER — OXYCODONE HCL 5 MG PO TABS
10.0000 mg | ORAL_TABLET | ORAL | Status: DC | PRN
  Administered 2015-10-08 – 2015-10-09 (×3): 10 mg via ORAL
  Filled 2015-10-08 (×3): qty 2

## 2015-10-08 MED ORDER — OXYCODONE HCL 5 MG PO TABS
5.0000 mg | ORAL_TABLET | ORAL | Status: DC | PRN
  Administered 2015-10-09: 5 mg via ORAL
  Filled 2015-10-08: qty 1

## 2015-10-08 NOTE — Lactation Note (Signed)
This note was copied from the chart of Marissa Whitaker. Lactation Consultation Note  Patient Name: Marissa BouchardBoyA Marissa Whitaker MVHQI'OToday's Date: 10/08/2015   NICU triplets 14 hours old and 32w4 CGA. Mom states that she does not want to nurse/pump her triplets. Mom did nurse her older children, and states that she remembers her milk coming in well with last child. Provided anticipatory guidance.   Maternal Data    Feeding    LATCH Score/Interventions                      Lactation Tools Discussed/Used     Consult Status      Geralynn OchsWILLIARD, Halley Shepheard 10/08/2015, 9:25 AM

## 2015-10-08 NOTE — Anesthesia Postprocedure Evaluation (Signed)
Anesthesia Post Note  Patient: Marissa Whitaker  Procedure(s) Performed: Procedure(s) (LRB): CESAREAN SECTION (N/A)  Patient location during evaluation: Mother Baby Anesthesia Type: Spinal Level of consciousness: oriented and awake and alert Pain management: pain level controlled Vital Signs Assessment: post-procedure vital signs reviewed and stable Respiratory status: spontaneous breathing, respiratory function stable and patient connected to nasal cannula oxygen Cardiovascular status: blood pressure returned to baseline and stable Postop Assessment: no headache and no backache Anesthetic complications: no    Last Vitals:  Filed Vitals:   10/08/15 1752 10/08/15 2113  BP: 122/75 115/78  Pulse: 84 69  Temp: 37.1 C 36.9 C  Resp: 18 18    Last Pain:  Filed Vitals:   10/08/15 2115  PainSc: 8                  Cecile HearingStephen Edward Sae Handrich

## 2015-10-08 NOTE — Progress Notes (Signed)
Marissa Whitaker appears at ease after the birth of her triplets and reports that she is doing well.  She had her two older children (ages, [phone] and [phone]) up to meet their baby brothers. Her birthday is tomorrow and her children were excited to celebrate with her.  She did not report any particular concerns or need for ongoing support, but she is aware of our availability.  Chaplain Dyanne CarrelKaty Jhonathan Desroches, Bcc Pager, [phone] 3:52 PM    10/08/15 1500  Clinical Encounter Type  Visited With Patient  Visit Type Initial

## 2015-10-08 NOTE — Progress Notes (Signed)
Subjective: Postpartum Day 1: Cesarean Delivery and BTS of triplets at 5747w3d; presented in active labor Patient reports incisional pain and tolerating PO.  Ambulating.  No flatus yet. Planning to formula feed infants; but will try breastfeeding.  Objective: Vital signs in last 24 hours: Temp:  [97.5 F (36.4 C)-99.1 F (37.3 C)] 98.5 F (36.9 C) (11/30 0548) Pulse Rate:  [64-108] 64 (11/30 0548) Resp:  [16-23] 18 (11/30 0548) BP: (98-135)/(61-90) 110/71 mmHg (11/30 0548) SpO2:  [96 %-100 %] 97 % (11/30 0548) Weight:  [149 lb (67.586 kg)] 149 lb (67.586 kg) (11/29 1628)  Physical Exam:  General: alert and no distress Lochia: appropriate Uterine Fundus: firm Incision: healing well, some pink drainage noted. DVT Evaluation: No evidence of DVT seen on physical exam.  Negative Homan's sign. No cords or calf tenderness.   Recent Labs  10/07/15 1730 10/08/15 0520  HGB 10.0* 9.0*  HCT 30.8* 27.6*    Assessment/Plan: Status post Cesarean section and BTS. Doing well postoperatively.  Will remove foley, awaiting flatus, On oral iron therapy Oral pain medications, encourage OOB Routine postpartum care   Mylasia Vorhees A, MD 10/08/2015, 6:56 AM

## 2015-10-08 NOTE — Progress Notes (Signed)
CSW attempted to meet with MOB to complete assessment due to NICU admission of triplets, but she had multiple visitors at this time.  CSW will attempt again at a later time.  MOB states she is doing well. 

## 2015-10-09 MED ORDER — OXYCODONE HCL 10 MG PO TABS: 10.0000 mg | ORAL_TABLET | ORAL | Status: DC | PRN

## 2015-10-09 MED ORDER — IBUPROFEN 600 MG PO TABS: 600.0000 mg | ORAL_TABLET | Freq: Four times a day (QID) | ORAL | Status: AC | PRN

## 2015-10-09 NOTE — Progress Notes (Signed)
Pt verbalizes understanding of d/c instructions, medications, follow up appts, when to seek medical attention and belongings policy. No IV at this time. Pt has no questions. Pt escorted to the main entrance accompanied by NT. Pts family member will be driving her home. Sheryn BisonGordon, Armanii Pressnell Warner

## 2015-10-09 NOTE — Discharge Summary (Signed)
OB Discharge Summary     Patient Name: Marissa Whitaker DOB: 1976/01/16 MRN: [id number]  Date of admission: 10/07/2015 Delivering MD:    Auset, Fritzler [[id number]]  Greig Right, [facility] Walter [[id number]]  Ann, Bohne [[id number]]  Jaynie Collins A   Date of discharge: 10/09/2015  Admitting diagnosis: 32w 3d 3.5 cm dilated, triplets, ctx Intrauterine pregnancy: [redacted]w[redacted]d     Secondary diagnosis:  Active Problems:   S/P cesarean section  Additional problems: none     Discharge diagnosis: Term Pregnancy Delivered and BTL                                                                                                Post partum procedures:none  Augmentation: N/A  Complications: None  Hospital course:  Sceduled C/S   39 y.o. yo [id number] at [redacted]w[redacted]d was evaluated at the hospital 10/07/2015 for contractions. She was found to be progressing into active labor and therefore was taken for a cesarean section & BTL with the following indication:Multifetal Gestation.  Membrane Rupture Time/Date:    Rolena, Knutson [[id number]]  6:26 PM   Nita Sells Ailea [[id number]]  6:28 PM   Ashayla, Subia [place] [[id number]]  6:30 PM ,   Moe, Brier [[id number]]  10/07/2015   Bernese, Doffing [[id number]]  10/07/2015   Dawne, Casali [[id number]]  10/07/2015   Patient delivered Viable infants x 3.   Alexys, Lobello [[id number]]  10/07/2015   Nita Sells Mikeria [[id number]]  10/07/2015   Kaia, Depaolis [[id number]]  10/07/2015  Details of operation can be found in separate operative note.  Pateint had an uncomplicated postpartum course.  She is ambulating, tolerating a regular diet, passing flatus, and urinating well. Patient is discharged home in stable condition on 10/09/15.          Physical exam  Filed Vitals:   10/08/15 1520 10/08/15 1752 10/08/15 2113 10/09/15 0519  BP: 103/68 122/75 115/78 118/78  Pulse: 63 84 69 72   Temp: 98.4 F (36.9 C) 98.8 F (37.1 C) 98.4 F (36.9 C) 98.3 F (36.8 C)  TempSrc: Oral Oral Oral Oral  Resp: SpO2: 100% 100% 100% 100%   General: alert and cooperative Lochia: appropriate Uterine Fundus: firm Incision: Dressing is clean, dry, and intact DVT Evaluation: No evidence of DVT seen on physical exam. Labs: Lab Results  Component Value Date   WBC 13.0* 10/08/2015   HGB 9.0* 10/08/2015   HCT 27.6* 10/08/2015   MCV 75.4* 10/08/2015   PLT 187 10/08/2015   No flowsheet data found.  Discharge instruction: per After Visit Summary and "Baby and Me Booklet".  After visit meds:    Medication List    STOP taking these medications        aspirin EC 81 MG tablet     NIFEdipine 30 MG 24 hr tablet  Commonly known as:  PROCARDIA XL     promethazine 25 MG tablet  Commonly known as:  PHENERGAN      TAKE these medications  ferrous sulfate 325 (65 FE) MG tablet  Take 1 tablet (325 mg total) by mouth daily with breakfast.     ibuprofen 600 MG tablet  Commonly known as:  ADVIL,MOTRIN  Take 1 tablet (600 mg total) by mouth every 6 (six) hours as needed.     Oxycodone HCl 10 MG Tabs  Take 1 tablet (10 mg total) by mouth every 4 (four) hours as needed for severe pain.     prenatal multivitamin Tabs tablet  Take 1 tablet by mouth daily at 12 noon.        Diet: routine diet  Activity: Advance as tolerated. Pelvic rest for 6 weeks.   Outpatient follow up:6 weeks Follow up Appt:Future Appointments Date Time Provider Department Center  11/12/2015 1:45 PM Catalina AntiguaPeggy Constant, MD [facility]   Follow up Visit:No Follow-up on file.  Postpartum contraception: Tubal Ligation  Newborn Data:   Laruth BouchardBryant, BoyA Jaila [[id number]][[id number]]  Live born female  Birth Weight: 3 lb 2.4 oz (1430 g) APGAR: 8, 9   Virgie DadBryant, BoyC Sherah [[id number]][[id number]]  Live born female  Birth Weight: 3 lb 6 oz (1530 g) APGAR: 6, 9   Eustace QuailBryant, BoyB Ziomara [[id number]][[id number]]  Live born female  Birth  Weight: 3 lb 11.6 oz (1690 g) APGAR: 8, 9  Baby Feeding: Bottle Disposition:NICU   10/09/2015 Cam HaiSHAW, Mikell Camp, CNM  10/09/2015

## 2015-10-09 NOTE — Clinical Social Work Maternal (Signed)
CLINICAL SOCIAL WORK MATERNAL/CHILD NOTE  Patient Details  Name: Marissa Whitaker MRN: [id number] Date of Birth: 10/04/1976  Date:  10/09/2015  Clinical Social Worker Initiating Note:  Hilmer Aliberti E. Brigitte Pulse, Soledad Date/ Time Initiated:  10/09/15/0950     Child's Name:  A: Dorann Lodge   Legal Guardian:   (Parents: Marissa Whitaker and Shona Simpson )   Need for Interpreter:  None   Date of Referral:        Reason for Referral:   (No referral: NICU admission of triplets)   Referral Source:      Address:  [address], [place], [place] [address]  Phone number:  [phone]   Household Members:  Minor Children, Significant Other (MOB and FOB/fiance live in the home with their two sons..  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  Strengths:  Ability to meet basic needs , Compliance with medical plan , Pediatrician chosen , Understanding of illness, Home prepared for child  (P[facility].  She states they have a lot of baby items, but only one crib and have not purchased car seats yet.)   Risk Factors/Current Problems:  None   Cognitive State:  Alert , Linear Thinking , Goal Oriented , Insightful    Mood/Affect:  Calm , Comfortable , Relaxed , Interested    CSW Assessment: CSW met with MOB in her third floor room/306 to introduce services, offer support and complete assessment due  to admission of triplets to NICU.  MOB was pleasant, welcoming and appeared to be in good spirits.  She reports the babies are doing very well, for which she is thankful.  She reports, "it was a shock" when she found out she was having triplets and admits that the information was "a lot" to process.  She added that she thought it was twins at her initial ultrasound and learned a few weeks later that there were three babies.  She also reports that she had an IUD at the time of conception, and therefore, not planning on getting pregnant.  She concluded, "but I love them" very enthusiastically.  CSW observed MOB interacting with babies this morning and bonding is evident.  She reports feeling comfortable with the care her babies are receiving and states no question or concerns regarding the NICU. MOB states she has a wonderful support system of family and friends.  She reports that her fiance's family is local, involved and supportive as well.  MOB has two sons with FOB, ages 60 [place]) and 23 [place]).  MOB has three older daughters, ages 57 ([place]), 56 ([place]), and 87 ([place]) from previously relationships.  She reports that their father's family is also involved and supportive.  MOB states that they have received numerous gifts from family and friends and have much of the baby supplies that they will need.  She said, "we  have a good start."  She reports only one crib at home and was understanding of CSW's recommendation to place each baby in his own bed as a SIDS precaution.  CSW offered beds from [facility] and MOB gladly accepted.  CSW will make referral.  She may need assistance from [employer] for a car seat, but states they are working on purchasing car seats.  CSW advised that she speak with babies' RN regarding the type of seat the babies will need.  She stated understanding.   MOB reports no history of mental illness, including no history of PPD.  She reports a positive emotional  experience after the births of each of her other children and states no concerns that she may experience PPD after this delivery.  CSW reviewed signs and symptoms to watch for and provided education that it is normal and treatable if it occurs.  MOB seemed appreciative of CSW's concern for her emotional wellbeing.   MOB reports wishing to return to work as soon as she is cleared by her MD and states FOB has some time off now.  She reports recovery from c-section as much more difficult than all of her previous vaginal deliveries and is looking forward to feeling better.  She states no issues with transportation to be able to be with the babies after her discharge today. CSW explained ongoing support services offered by NICU CSW and gave contact information.   CSW Plan/Description:  Patient/Family Education , Psychosocial Support and Ongoing Assessment of Needs, Information/Referral to Community Resources     Tenae Graziosi Elizabeth, LCSW 10/09/2015, 12:46 PM 

## 2015-10-09 NOTE — Discharge Instructions (Signed)

## 2015-10-14 ENCOUNTER — Ambulatory Visit (HOSPITAL_COMMUNITY): Payer: Medicaid Other

## 2015-10-21 ENCOUNTER — Ambulatory Visit (HOSPITAL_COMMUNITY): Payer: Medicaid Other

## 2015-10-22 ENCOUNTER — Telehealth: Payer: Self-pay | Admitting: General Practice

## 2015-10-22 DIAGNOSIS — G8918 Other acute postprocedural pain: Secondary | ICD-10-CM

## 2015-10-22 MED ORDER — OXYCODONE HCL 10 MG PO TABS: 10.0000 mg | ORAL_TABLET | Freq: Four times a day (QID) | ORAL | Status: AC | PRN

## 2015-10-22 NOTE — Telephone Encounter (Signed)
Patient called and left message stating she had her triplet boys on 11/29 by c/s and is still hurting pretty bad and cramping like she was in the hospital. Patient requests refill on medication.  Called patient and she states she is still having severe pain when she moves, walks around or coughs. Patient states the only time she doesn't hurt is if she doesn't move. Patient states the motrin does not help her. Per Illene BolusLori Clemmons, may refill oxycodone #10. Informed patient of refill and that she will need to come by the office to pick the Rx up. Discussed with patient pain expectation at this point from surgery & that she will still have some pain. Discussed taking motrin continuously and only taking one oxycodone if needed. Patient verbalized understanding to all & had no questions

## 2015-11-12 ENCOUNTER — Ambulatory Visit: Payer: Medicaid Other | Admitting: Obstetrics and Gynecology

## 2016-01-08 ENCOUNTER — Encounter: Payer: Self-pay | Admitting: Obstetrics & Gynecology

## 2016-01-08 ENCOUNTER — Ambulatory Visit (INDEPENDENT_AMBULATORY_CARE_PROVIDER_SITE_OTHER): Payer: Medicaid Other | Admitting: Obstetrics & Gynecology

## 2016-01-08 VITALS — BP 118/76 | HR 99 | Temp 98.5°F | Wt 113.5 lb

## 2016-01-08 DIAGNOSIS — R87612 Low grade squamous intraepithelial lesion on cytologic smear of cervix (LGSIL): Secondary | ICD-10-CM

## 2016-01-08 LAB — POCT URINALYSIS DIP (DEVICE)
Bilirubin Urine: NEGATIVE
GLUCOSE, UA: NEGATIVE mg/dL
Hgb urine dipstick: NEGATIVE
KETONES UR: NEGATIVE mg/dL
Leukocytes, UA: NEGATIVE
Nitrite: NEGATIVE
PH: 6 (ref 5.0–8.0)
PROTEIN: 30 mg/dL — AB
Specific Gravity, Urine: >=1.03 (ref 1.005–1.030)
UROBILINOGEN UA: 0.2 mg/dL (ref 0.0–1.0)

## 2016-01-08 NOTE — Progress Notes (Signed)
Subjective:     Marissa Whitaker is a 40 y.o. female who presents for a postpartum visit. She is 3 months   postpartum following a low cervical transverse Cesarean section. I have fully reviewed the prenatal and intrapartum course. The delivery was at 32.5 gestational weeks. Outcome: primary cesarean section, low transverse incision. Anesthesia: spinal. Postpartum course has been good, triplets. Baby's course has been good. Baby is feeding by bottle -  . Bleeding no bleeding. Bowel function is normal. Bladder function is normal. Patient is sexually active. Contraception method is tubal ligation. Postpartum depression screening: negative.  The following portions of the patient's history were reviewed and updated as appropriate: allergies, current medications, past family history, past medical history, past social history, past surgical history and problem list.  Review of Systems Pertinent items are noted in HPI.   Objective:    BP 118/76 mmHg  Pulse 99  Temp(Src) 98.5 F (36.9 C)  Wt 113 lb 8 oz (51.483 kg)  General:  alert, cooperative and mild distress   Breasts:     Lungs: nl effort  Heart:     Abdomen: soft, non-tender; bowel sounds normal; no masses,  no organomegaly and incision well healed   Vulva:  not evaluated  Vagina: not evaluated   Assessment:     normal postpartum exam. Pap smear not done at today's visit.   Plan:    1. Contraception: tubal ligation 2. Needs pap and cotesting in 6 months 3. Follow up in: 6 months or as needed.    Adam Phenix, MD 01/08/2016

## 2016-01-08 NOTE — Progress Notes (Signed)
Patient ID: Marissa Whitaker, female   DO: 1975-12-02, 40 y.o.   MRN: [id number] Subjective:     Marissa Whitaker is a 40 y.o. female who presents for a postpartum visit. Pt delivered  triplets at 32 weeks and 3 days with the help of epidural. Babies are currently bottle feed. Patient received her tubal ligation in the hospital. She is currently sexually active. Bowels are moving fine. Pt is not bleeding at this time.

## 2016-01-08 NOTE — Patient Instructions (Signed)
Pap Test WHY AM I HAVING THIS TEST? A pap test is sometimes called a pap smear. It is a screening test that is used to check for signs of cancer of the vagina, cervix, and uterus. The test can also identify the presence of infection or precancerous changes. Your health care provider will likely recommend you have this test done on a regular basis. This test may be done:  Every 3 years, starting at age 38.  Every 5 years, in combination with testing for the presence of human papillomavirus (HPV).  More or less often depending on other medical conditions.  WHAT KIND OF SAMPLE IS TAKEN? Using a small cotton swab, plastic spatula, or brush, your health care provider will collect a sample of cells from the surface of your cervix. Your cervix is the opening to your uterus, also called a womb. Secretions from the cervix and vagina may also be collected. HOW DO I PREPARE FOR THE TEST?  Be aware of where you are in your menstrual cycle. You may be asked to reschedule the test if you are menstruating on the day of the test.  You may need to reschedule if you have a known vaginal infection on the day of the test.  You may be asked to avoid douching or taking a bath the day before or the day of the test.  Some medicines can cause abnormal test results, such as digitalis and tetracycline. Talk with your health care provider before your test if you take one of these medicines. WHAT DO THE RESULTS MEAN? Abnormal test results may indicate a number of health conditions. These may include:  Cancer. Although pap test results cannot be used to diagnose cancer of the cervix, vagina, or uterus, they may suggest the possibility of cancer. Further tests would be required to determine if cancer is present.  Sexually transmitted disease.  Fungal infection.  Parasite infection.  Herpes infection.  A condition causing or contributing to infertility. It is your responsibility to obtain your test results. Ask  the lab or department performing the test when and how you will get your results. Contact your health care provider to discuss any questions you have about your results.   This information is not intended to replace advice given to you by your health care provider. Make sure you discuss any questions you have with your health care provider.   Document Released: 01/15/2003 Document Revised: 11/15/2014 Document Reviewed: 03/18/2014 Elsevier Interactive Patient Education 2016 Elsevier Inc.  

## 2019-09-19 ENCOUNTER — Other Ambulatory Visit: Payer: Self-pay

## 2019-09-19 ENCOUNTER — Encounter (HOSPITAL_BASED_OUTPATIENT_CLINIC_OR_DEPARTMENT_OTHER): Payer: Self-pay | Admitting: Emergency Medicine

## 2019-09-19 ENCOUNTER — Emergency Department (HOSPITAL_BASED_OUTPATIENT_CLINIC_OR_DEPARTMENT_OTHER)
Admission: EM | Admit: 2019-09-19 | Discharge: 2019-09-19 | Disposition: A | Discharge status: Home / Self Care | Payer: BC Managed Care – PPO | Attending: Emergency Medicine | Admitting: Emergency Medicine

## 2019-09-19 DIAGNOSIS — Z20828 Contact with and (suspected) exposure to other viral communicable diseases: Secondary | ICD-10-CM | POA: Insufficient documentation

## 2019-09-19 DIAGNOSIS — J069 Acute upper respiratory infection, unspecified: Secondary | ICD-10-CM

## 2019-09-19 DIAGNOSIS — Z5321 Procedure and treatment not carried out due to patient leaving prior to being seen by health care provider: Secondary | ICD-10-CM | POA: Diagnosis not present

## 2019-09-19 DIAGNOSIS — F17210 Nicotine dependence, cigarettes, uncomplicated: Secondary | ICD-10-CM | POA: Insufficient documentation

## 2019-09-19 DIAGNOSIS — R0981 Nasal congestion: Secondary | ICD-10-CM | POA: Diagnosis present

## 2019-09-19 MED ORDER — FLUTICASONE PROPIONATE 50 MCG/ACT NA SUSP
1.0000 | Freq: Every day | NASAL | 0 refills | Status: AC
Start: 2019-09-19 — End: ?

## 2019-09-19 NOTE — ED Provider Notes (Signed)
[facility] EMERGENCY DEPARTMENT Provider Note   CSN: [id number] Arrival date & time: 09/19/19  1203     History   Chief Complaint Chief Complaint  Patient presents with  . Nasal Congestion  . Headache    HPI Marissa Whitaker is a 43 y.o. female who presents with nasal congestion.  Patient states that since yesterday she has had nasal congestion and body aches.  She also is reporting a generalized headache and facial pain and a scratchy throat yesterday which has resolved.  She has had some lightheadedness today.  She denies fever, chills, current headache, loss of consciousness, sore throat, ear pain, cough, chest pain or shortness of breath.  No nausea vomiting or diarrhea.  She denies any sick contacts.  She has been taking Tylenol and DayQuil for her symptoms without significant relief.  She states that she has a lot of "sinus issues".     HPI  Past Medical History:  Diagnosis Date  . Advanced maternal age in multigravida   . Triplet gestation     Patient Active Problem List   Diagnosis Date Noted  . Dysplasia of cervix, low grade (CIN 1) 09/03/2015    Past Surgical History:  Procedure Laterality Date  . CESAREAN SECTION N/A 10/07/2015   Procedure: CESAREAN SECTION;  Surgeon: Osborne Oman, MD;  Location: [facility];  Service: Obstetrics;  Laterality: N/A;  . TONSILLECTOMY       OB History    Gravida  7   Para  6   Term  5   Preterm  1   AB  1   Living  8     SAB  1   TAB  0   Ectopic  0   Multiple  1   Live Births  8            Home Medications    Prior to Admission medications   Medication Sig Start Date End Date Taking? Authorizing Provider  ferrous sulfate 325 (65 FE) MG tablet Take 1 tablet (325 mg total) by mouth daily with breakfast. 09/04/15   Wouk, Ailene Rud, MD  fluticasone [facility]) 50 MCG/ACT nasal spray Place 1 spray into both nostrils daily. 09/19/19   Recardo Evangelist, PA-C  ibuprofen (ADVIL,MOTRIN) 600 MG  tablet Take 1 tablet (600 mg total) by mouth every 6 (six) hours as needed. 10/09/15   Myrtis Ser, CNM  Oxycodone HCl 10 MG TABS Take 1 tablet (10 mg total) by mouth every 6 (six) hours as needed. 10/22/15   Clemmons, Bertram Gala, CNM  Prenatal Vit-Fe Fumarate-FA (PRENATAL MULTIVITAMIN) TABS tablet Take 1 tablet by mouth daily at 12 noon.    [provider]    Family History No family history on file.  Social History Social History   Tobacco Use  . Smoking status: Current Every Day Smoker  Substance Use Topics  . Alcohol use: No  . Drug use: No     Allergies   Patient has no known allergies.   Review of Systems Review of Systems  Constitutional: Negative for chills and fever.  HENT: Positive for congestion, rhinorrhea and sinus pressure. Negative for ear pain and sore throat.   Respiratory: Negative for cough and shortness of breath.   Cardiovascular: Negative for chest pain.  Gastrointestinal: Negative for diarrhea, nausea and vomiting.  Neurological: Negative for syncope, light-headedness and headaches.  All other systems reviewed and are negative.    Physical Exam Updated Vital Signs BP 113/76 (BP  Location: Right Arm)   Pulse 82   Temp 97.7 F (36.5 C) (Oral)   Resp 18   Ht 5\' 2"  (1.575 m)   Wt 54.9 kg   LMP 08/23/2019   SpO2 100%   BMI 22.13 kg/m   Physical Exam Vitals signs and nursing note reviewed.  Constitutional:      General: She is not in acute distress.    Appearance: She is well-developed. She is not ill-appearing.  HENT:     Head: Normocephalic and atraumatic.     Right Ear: Tympanic membrane normal.     Left Ear: Tympanic membrane normal.     Nose: Nose normal.     Mouth/Throat:     Lips: Pink.     Mouth: Mucous membranes are moist.     Pharynx: Oropharynx is clear.  Eyes:     General: No scleral icterus.       Right eye: No discharge.        Left eye: No discharge.     Conjunctiva/sclera: Conjunctivae normal.     Pupils:  Pupils are equal, round, and reactive to light.  Neck:     Musculoskeletal: Normal range of motion.  Cardiovascular:     Rate and Rhythm: Normal rate.  Pulmonary:     Effort: Pulmonary effort is normal. No respiratory distress.  Abdominal:     General: There is no distension.  Skin:    General: Skin is warm and dry.  Neurological:     Mental Status: She is alert and oriented to person, place, and time.  Psychiatric:        Behavior: Behavior normal.      ED Treatments / Results  Labs (all labs ordered are listed, but only abnormal results are displayed) Labs Reviewed  SARS CORONAVIRUS 2 (TAT 6-24 HRS)    EKG None  Radiology No results found.  Procedures Procedures (including critical care time)  Medications Ordered in ED Medications - No data to display   Initial Impression / Assessment and Plan / ED Course  I have reviewed the triage vital signs and the nursing notes.  Pertinent labs & imaging results that were available during my care of the patient were reviewed by me and considered in my medical decision making (see chart for details).  43 year old female presents with URI symptoms and body aches since yesterday.  Her vital signs are normal.  She is developed very well-appearing on exam.  History and exam are consistent with viral illness.  Her main concern is her nasal congestion and sinus pressure.  Advised Flonase which was given to her as a prescription.  Covid testing was discussed.  She does not really want this however her family member in the room does want her to have it.  Ultimately the patient agreed to Covid testing.  She is advised to self quarantine until till she receives her results.  Final Clinical Impressions(s) / ED Diagnoses   Final diagnoses:  Upper respiratory tract infection, unspecified type    ED Discharge Orders         Ordered    fluticasone (FLONASE) 50 MCG/ACT nasal spray  Daily     09/19/19 1238           13/11/20, PA-C 09/19/19 1246    13/11/20, MD 09/19/19 13/11/20

## 2019-09-19 NOTE — Discharge Instructions (Signed)
Please quarantine at home until you get the results of your COVID test (it may take up to 24 hours) Rest and drink plenty of fluids Take Tylenol or Ibuprofen for pain Use Flonase for nasal and sinus congestion Return if you are worsening

## 2019-09-19 NOTE — ED Triage Notes (Signed)
Reports cold like symptoms started yesterday, nasal congestion, body aches, headache and dizziness. denies sore throat nor cough, took dayquil and tylenol.

## 2019-09-23 LAB — NOVEL CORONAVIRUS, NAA (HOSP ORDER, SEND-OUT TO REF LAB; TAT 18-24 HRS): SARS-CoV-2, NAA: NOT DETECTED

## 2019-09-25 ENCOUNTER — Telehealth (HOSPITAL_COMMUNITY): Payer: Self-pay

## 2021-04-01 ENCOUNTER — Other Ambulatory Visit: Payer: Self-pay

## 2021-04-01 ENCOUNTER — Encounter (HOSPITAL_BASED_OUTPATIENT_CLINIC_OR_DEPARTMENT_OTHER): Payer: Self-pay | Admitting: *Deleted

## 2021-04-01 ENCOUNTER — Emergency Department (HOSPITAL_BASED_OUTPATIENT_CLINIC_OR_DEPARTMENT_OTHER)
Admission: EM | Admit: 2021-04-01 | Discharge: 2021-04-01 | Disposition: A | Discharge status: Home / Self Care | Payer: Medicaid Other | Attending: Emergency Medicine | Admitting: Emergency Medicine

## 2021-04-01 DIAGNOSIS — Z20822 Contact with and (suspected) exposure to covid-19: Secondary | ICD-10-CM | POA: Diagnosis not present

## 2021-04-01 DIAGNOSIS — F17200 Nicotine dependence, unspecified, uncomplicated: Secondary | ICD-10-CM | POA: Insufficient documentation

## 2021-04-01 DIAGNOSIS — J069 Acute upper respiratory infection, unspecified: Secondary | ICD-10-CM | POA: Diagnosis not present

## 2021-04-01 DIAGNOSIS — R059 Cough, unspecified: Secondary | ICD-10-CM | POA: Diagnosis present

## 2021-04-01 LAB — RESP PANEL BY RT-PCR (FLU A&B, COVID) ARPGX2
Influenza A by PCR: NEGATIVE
Influenza B by PCR: NEGATIVE
SARS Coronavirus 2 by RT PCR: NEGATIVE

## 2021-04-01 NOTE — ED Triage Notes (Signed)
C/o nasal congestion x 2 weeks

## 2021-04-01 NOTE — ED Provider Notes (Signed)
[facility] EMERGENCY DEPARTMENT Provider Note   CSN: [id number] Arrival date & time: 04/01/21  1747     History Chief Complaint  Patient presents with  . Nasal Congestion    Marissa Whitaker is a 45 y.o. female.  The history is provided by the patient. No language interpreter was used.  Cough Cough characteristics:  Productive Sputum characteristics:  Nondescript Severity:  Moderate Onset quality:  Gradual Timing:  Constant Progression:  Worsening Chronicity:  New Smoker: yes   Relieved by:  Nothing Worsened by:  Nothing Associated symptoms: sinus congestion   Risk factors: no recent infection    Pt complains of cough and congestion for a week.  Pt reports she is starting to feel better.     Past Medical History:  Diagnosis Date  . Advanced maternal age in multigravida   . Triplet gestation     Patient Active Problem List   Diagnosis Date Noted  . Dysplasia of cervix, low grade (CIN 1) 09/03/2015    Past Surgical History:  Procedure Laterality Date  . CESAREAN SECTION N/A 10/07/2015   Procedure: CESAREAN SECTION;  Surgeon: Tereso Newcomer, MD;  Location: [facility];  Service: Obstetrics;  Laterality: N/A;  . TONSILLECTOMY    . TUBAL LIGATION       OB History    Gravida  7   Para  6   Term  5   Preterm  1   AB  1   Living  8     SAB  1   IAB  0   Ectopic  0   Multiple  1   Live Births  8           No family history on file.  Social History   Tobacco Use  . Smoking status: Current Every Day Smoker    Packs/day: 0.50  . Smokeless tobacco: Never Used  Vaping Use  . Vaping Use: Never used  Substance Use Topics  . Alcohol use: No  . Drug use: No    Home Medications Prior to Admission medications   Medication Sig Start Date End Date Taking? Authorizing Provider  ferrous sulfate 325 (65 FE) MG tablet Take 1 tablet (325 mg total) by mouth daily with breakfast. 09/04/15   Wouk, Wilfred Curtis, MD  fluticasone [facility]) 50  MCG/ACT nasal spray Place 1 spray into both nostrils daily. 09/19/19   Bethel Born, PA-C  ibuprofen (ADVIL,MOTRIN) 600 MG tablet Take 1 tablet (600 mg total) by mouth every 6 (six) hours as needed. 10/09/15   Arabella Merles, CNM  Oxycodone HCl 10 MG TABS Take 1 tablet (10 mg total) by mouth every 6 (six) hours as needed. 10/22/15   Clemmons, Elmore Guise, CNM  Prenatal Vit-Fe Fumarate-FA (PRENATAL MULTIVITAMIN) TABS tablet Take 1 tablet by mouth daily at 12 noon.    [provider]    Allergies    Patient has no known allergies.  Review of Systems   Review of Systems  Respiratory: Positive for cough.   All other systems reviewed and are negative.   Physical Exam Updated Vital Signs BP 129/85 (BP Location: Left Arm)   Pulse 88   Temp 98.7 F (37.1 C) (Oral)   Resp 16   Ht 5\' 4"  (1.626 m)   Wt 63 kg   SpO2 100%   BMI 23.86 kg/m   Physical Exam Constitutional:      Appearance: She is well-developed.  HENT:     Head: Normocephalic  and atraumatic.     Mouth/Throat:     Pharynx: Posterior oropharyngeal erythema present.  Eyes:     Conjunctiva/sclera: Conjunctivae normal.     Pupils: Pupils are equal, round, and reactive to light.  Cardiovascular:     Rate and Rhythm: Normal rate.  Pulmonary:     Effort: Pulmonary effort is normal.  Abdominal:     Palpations: Abdomen is soft.  Musculoskeletal:        General: Normal range of motion.     Cervical back: Normal range of motion and neck supple.  Skin:    General: Skin is warm and dry.  Neurological:     General: No focal deficit present.     Mental Status: She is alert and oriented to person, place, and time.  Psychiatric:        Mood and Affect: Mood normal.     ED Results / Procedures / Treatments   Labs (all labs ordered are listed, but only abnormal results are displayed) Labs Reviewed  RESP PANEL BY RT-PCR (FLU A&B, COVID) [id number]    EKG None  Radiology No results  found.  Procedures Procedures   Medications Ordered in ED Medications - No data to display  ED Course  I have reviewed the triage vital signs and the nursing notes.  Pertinent labs & imaging results that were available during my care of the patient were reviewed by me and considered in my medical decision making (see chart for details).    MDM Rules/Calculators/A&P                          Covid and influenza are negative.  I suspect viral illness, Pt advised symptomatic care.  Final Clinical Impression(s) / ED Diagnoses Final diagnoses:  Viral URI with cough    Rx / DC Orders ED Discharge Orders    None    An After Visit Summary was printed and given to the patient.    Elson Areas, [place] 04/01/21 1945    Derwood Kaplan, MD 04/03/21 [phone]
